# Patient Record
Sex: Female | Born: 1960 | Race: White | Hispanic: No | Smoking: Current every day smoker
Health system: Southern US, Community
[De-identification: ages and names within clinical notes are randomized; demographics above are authoritative.]

## PROBLEM LIST (undated history)

## (undated) DIAGNOSIS — F4310 Post-traumatic stress disorder, unspecified: Secondary | ICD-10-CM

## (undated) DIAGNOSIS — F329 Major depressive disorder, single episode, unspecified: Secondary | ICD-10-CM

## (undated) DIAGNOSIS — M797 Fibromyalgia: Secondary | ICD-10-CM

## (undated) DIAGNOSIS — K222 Esophageal obstruction: Secondary | ICD-10-CM

## (undated) DIAGNOSIS — E039 Hypothyroidism, unspecified: Secondary | ICD-10-CM

## (undated) DIAGNOSIS — K219 Gastro-esophageal reflux disease without esophagitis: Secondary | ICD-10-CM

## (undated) DIAGNOSIS — G8929 Other chronic pain: Secondary | ICD-10-CM

## (undated) DIAGNOSIS — F419 Anxiety disorder, unspecified: Secondary | ICD-10-CM

## (undated) DIAGNOSIS — E119 Type 2 diabetes mellitus without complications: Secondary | ICD-10-CM

## (undated) DIAGNOSIS — F32A Depression, unspecified: Secondary | ICD-10-CM

## (undated) DIAGNOSIS — G629 Polyneuropathy, unspecified: Secondary | ICD-10-CM

## (undated) DIAGNOSIS — M549 Dorsalgia, unspecified: Secondary | ICD-10-CM

## (undated) DIAGNOSIS — G43909 Migraine, unspecified, not intractable, without status migrainosus: Secondary | ICD-10-CM

## (undated) DIAGNOSIS — K635 Polyp of colon: Secondary | ICD-10-CM

## (undated) HISTORY — DX: Anxiety disorder, unspecified: F41.9

## (undated) HISTORY — DX: Migraine, unspecified, not intractable, without status migrainosus: G43.909

## (undated) HISTORY — PX: COLONOSCOPY: SHX174

## (undated) HISTORY — DX: Depression, unspecified: F32.A

## (undated) HISTORY — DX: Fibromyalgia: M79.7

## (undated) HISTORY — DX: Gastro-esophageal reflux disease without esophagitis: K21.9

## (undated) HISTORY — PX: KNEE ARTHROSCOPY: SUR90

## (undated) HISTORY — PX: BACK SURGERY: SHX140

## (undated) HISTORY — PX: ROTATOR CUFF REPAIR: SHX139

## (undated) HISTORY — DX: Polyp of colon: K63.5

## (undated) HISTORY — PX: THYROID SURGERY: SHX805

## (undated) HISTORY — DX: Post-traumatic stress disorder, unspecified: F43.10

## (undated) HISTORY — PX: FOOT SURGERY: SHX648

## (undated) HISTORY — PX: KNEE SURGERY: SHX244

## (undated) HISTORY — DX: Hypothyroidism, unspecified: E03.9

## (undated) HISTORY — PX: PARTIAL HYSTERECTOMY: SHX80

## (undated) HISTORY — DX: Dorsalgia, unspecified: M54.9

## (undated) HISTORY — DX: Other chronic pain: G89.29

## (undated) HISTORY — DX: Polyneuropathy, unspecified: G62.9

## (undated) HISTORY — DX: Type 2 diabetes mellitus without complications: E11.9

## (undated) HISTORY — DX: Major depressive disorder, single episode, unspecified: F32.9

## (undated) HISTORY — DX: Esophageal obstruction: K22.2

---

## 1998-06-24 ENCOUNTER — Emergency Department (HOSPITAL_COMMUNITY): Admission: EM | Admit: 1998-06-24 | Discharge: 1998-06-24 | Payer: Self-pay | Admitting: Emergency Medicine

## 1998-10-16 ENCOUNTER — Other Ambulatory Visit: Admission: RE | Admit: 1998-10-16 | Discharge: 1998-10-16 | Payer: Self-pay | Admitting: *Deleted

## 1999-07-17 ENCOUNTER — Encounter: Admission: RE | Admit: 1999-07-17 | Discharge: 1999-07-17 | Payer: Self-pay | Admitting: *Deleted

## 1999-07-29 ENCOUNTER — Inpatient Hospital Stay (HOSPITAL_COMMUNITY): Admission: RE | Admit: 1999-07-29 | Discharge: 1999-07-31 | Payer: Self-pay | Admitting: *Deleted

## 1999-07-29 ENCOUNTER — Encounter (INDEPENDENT_AMBULATORY_CARE_PROVIDER_SITE_OTHER): Payer: Self-pay

## 2000-05-13 ENCOUNTER — Other Ambulatory Visit: Admission: RE | Admit: 2000-05-13 | Discharge: 2000-05-13 | Payer: Self-pay | Admitting: Obstetrics and Gynecology

## 2000-07-29 ENCOUNTER — Encounter: Admission: RE | Admit: 2000-07-29 | Discharge: 2000-07-29 | Payer: Self-pay | Admitting: *Deleted

## 2000-11-18 ENCOUNTER — Other Ambulatory Visit: Admission: RE | Admit: 2000-11-18 | Discharge: 2000-11-18 | Payer: Self-pay | Admitting: *Deleted

## 2000-12-02 ENCOUNTER — Encounter: Payer: Self-pay | Admitting: Urology

## 2000-12-09 ENCOUNTER — Ambulatory Visit (HOSPITAL_COMMUNITY): Admission: RE | Admit: 2000-12-09 | Discharge: 2000-12-09 | Payer: Self-pay | Admitting: Urology

## 2001-06-27 ENCOUNTER — Emergency Department (HOSPITAL_COMMUNITY): Admission: EM | Admit: 2001-06-27 | Discharge: 2001-06-28 | Payer: Self-pay | Admitting: *Deleted

## 2001-06-27 ENCOUNTER — Encounter: Payer: Self-pay | Admitting: Emergency Medicine

## 2001-07-07 ENCOUNTER — Ambulatory Visit (HOSPITAL_COMMUNITY): Admission: RE | Admit: 2001-07-07 | Discharge: 2001-07-07 | Payer: Self-pay | Admitting: Internal Medicine

## 2001-07-07 ENCOUNTER — Encounter: Payer: Self-pay | Admitting: Internal Medicine

## 2001-07-16 ENCOUNTER — Encounter: Payer: Self-pay | Admitting: Internal Medicine

## 2001-07-16 ENCOUNTER — Ambulatory Visit (HOSPITAL_COMMUNITY): Admission: RE | Admit: 2001-07-16 | Discharge: 2001-07-16 | Payer: Self-pay | Admitting: Internal Medicine

## 2001-11-05 ENCOUNTER — Encounter: Admission: RE | Admit: 2001-11-05 | Discharge: 2001-11-05 | Payer: Self-pay | Admitting: *Deleted

## 2001-11-19 ENCOUNTER — Other Ambulatory Visit: Admission: RE | Admit: 2001-11-19 | Discharge: 2001-11-19 | Payer: Self-pay | Admitting: Obstetrics and Gynecology

## 2001-12-17 ENCOUNTER — Inpatient Hospital Stay (HOSPITAL_COMMUNITY): Admission: AD | Admit: 2001-12-17 | Discharge: 2001-12-17 | Payer: Self-pay | Admitting: Obstetrics and Gynecology

## 2002-04-25 ENCOUNTER — Encounter: Admission: RE | Admit: 2002-04-25 | Discharge: 2002-04-25 | Payer: Self-pay | Admitting: Otolaryngology

## 2002-04-25 ENCOUNTER — Encounter: Payer: Self-pay | Admitting: Otolaryngology

## 2002-06-28 ENCOUNTER — Encounter: Admission: RE | Admit: 2002-06-28 | Discharge: 2002-06-28 | Payer: Self-pay | Admitting: Internal Medicine

## 2002-06-28 ENCOUNTER — Encounter: Payer: Self-pay | Admitting: Internal Medicine

## 2002-09-28 ENCOUNTER — Encounter (INDEPENDENT_AMBULATORY_CARE_PROVIDER_SITE_OTHER): Payer: Self-pay | Admitting: Specialist

## 2002-09-28 ENCOUNTER — Ambulatory Visit (HOSPITAL_COMMUNITY): Admission: RE | Admit: 2002-09-28 | Discharge: 2002-09-28 | Payer: Self-pay | Admitting: Internal Medicine

## 2002-09-28 ENCOUNTER — Encounter: Payer: Self-pay | Admitting: Internal Medicine

## 2003-01-03 ENCOUNTER — Encounter: Payer: Self-pay | Admitting: Emergency Medicine

## 2003-01-03 ENCOUNTER — Emergency Department (HOSPITAL_COMMUNITY): Admission: EM | Admit: 2003-01-03 | Discharge: 2003-01-03 | Payer: Self-pay | Admitting: Emergency Medicine

## 2003-01-08 ENCOUNTER — Emergency Department (HOSPITAL_COMMUNITY): Admission: EM | Admit: 2003-01-08 | Discharge: 2003-01-08 | Payer: Self-pay | Admitting: Emergency Medicine

## 2003-10-19 ENCOUNTER — Encounter: Admission: RE | Admit: 2003-10-19 | Discharge: 2003-10-19 | Payer: Self-pay | Admitting: Internal Medicine

## 2004-03-18 ENCOUNTER — Emergency Department (HOSPITAL_COMMUNITY): Admission: EM | Admit: 2004-03-18 | Discharge: 2004-03-18 | Payer: Self-pay | Admitting: Emergency Medicine

## 2006-09-24 ENCOUNTER — Encounter: Admission: RE | Admit: 2006-09-24 | Discharge: 2006-09-24 | Payer: Self-pay | Admitting: Internal Medicine

## 2006-12-01 ENCOUNTER — Emergency Department (HOSPITAL_COMMUNITY): Admission: EM | Admit: 2006-12-01 | Discharge: 2006-12-01 | Payer: Self-pay | Admitting: *Deleted

## 2007-08-01 ENCOUNTER — Emergency Department (HOSPITAL_COMMUNITY): Admission: EM | Admit: 2007-08-01 | Discharge: 2007-08-01 | Payer: Self-pay | Admitting: Emergency Medicine

## 2008-03-27 ENCOUNTER — Ambulatory Visit (HOSPITAL_COMMUNITY): Admission: RE | Admit: 2008-03-27 | Discharge: 2008-03-27 | Payer: Self-pay | Admitting: Internal Medicine

## 2008-05-12 DIAGNOSIS — Z8659 Personal history of other mental and behavioral disorders: Secondary | ICD-10-CM

## 2008-05-12 DIAGNOSIS — K222 Esophageal obstruction: Secondary | ICD-10-CM

## 2008-05-12 DIAGNOSIS — K219 Gastro-esophageal reflux disease without esophagitis: Secondary | ICD-10-CM | POA: Insufficient documentation

## 2008-05-18 ENCOUNTER — Ambulatory Visit: Payer: Self-pay | Admitting: Internal Medicine

## 2008-05-23 ENCOUNTER — Telehealth: Payer: Self-pay | Admitting: Internal Medicine

## 2008-05-29 ENCOUNTER — Encounter: Payer: Self-pay | Admitting: Internal Medicine

## 2008-05-29 ENCOUNTER — Ambulatory Visit: Payer: Self-pay | Admitting: Internal Medicine

## 2008-05-31 LAB — CONVERTED CEMR LAB: UREASE: NEGATIVE

## 2008-06-01 ENCOUNTER — Encounter: Payer: Self-pay | Admitting: Internal Medicine

## 2008-06-05 ENCOUNTER — Emergency Department (HOSPITAL_COMMUNITY): Admission: EM | Admit: 2008-06-05 | Discharge: 2008-06-05 | Payer: Self-pay | Admitting: Emergency Medicine

## 2008-06-11 ENCOUNTER — Encounter: Admission: RE | Admit: 2008-06-11 | Discharge: 2008-06-11 | Payer: Self-pay | Admitting: Orthopedic Surgery

## 2008-06-29 ENCOUNTER — Encounter: Payer: Self-pay | Admitting: Internal Medicine

## 2008-07-11 ENCOUNTER — Ambulatory Visit (HOSPITAL_COMMUNITY): Admission: RE | Admit: 2008-07-11 | Discharge: 2008-07-11 | Payer: Self-pay | Admitting: Chiropractic Medicine

## 2008-08-01 ENCOUNTER — Telehealth: Payer: Self-pay | Admitting: Internal Medicine

## 2008-08-23 ENCOUNTER — Emergency Department (HOSPITAL_COMMUNITY): Admission: EM | Admit: 2008-08-23 | Discharge: 2008-08-23 | Payer: Self-pay | Admitting: Emergency Medicine

## 2008-10-13 ENCOUNTER — Observation Stay (HOSPITAL_COMMUNITY): Admission: RE | Admit: 2008-10-13 | Discharge: 2008-10-14 | Payer: Self-pay | Admitting: Neurosurgery

## 2008-10-17 ENCOUNTER — Telehealth: Payer: Self-pay | Admitting: Internal Medicine

## 2008-11-12 ENCOUNTER — Emergency Department (HOSPITAL_BASED_OUTPATIENT_CLINIC_OR_DEPARTMENT_OTHER): Admission: EM | Admit: 2008-11-12 | Discharge: 2008-11-12 | Payer: Self-pay | Admitting: Emergency Medicine

## 2008-12-07 ENCOUNTER — Encounter: Admission: RE | Admit: 2008-12-07 | Discharge: 2008-12-07 | Payer: Self-pay | Admitting: Neurosurgery

## 2008-12-28 ENCOUNTER — Encounter: Payer: Self-pay | Admitting: Internal Medicine

## 2009-01-10 ENCOUNTER — Encounter: Admission: RE | Admit: 2009-01-10 | Discharge: 2009-01-10 | Payer: Self-pay | Admitting: Obstetrics and Gynecology

## 2009-08-19 ENCOUNTER — Emergency Department (HOSPITAL_BASED_OUTPATIENT_CLINIC_OR_DEPARTMENT_OTHER): Admission: EM | Admit: 2009-08-19 | Discharge: 2009-08-19 | Payer: Self-pay | Admitting: Emergency Medicine
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
## 2010-01-28 ENCOUNTER — Encounter: Admission: RE | Admit: 2010-01-28 | Discharge: 2010-01-28 | Payer: Self-pay | Admitting: Obstetrics and Gynecology

## 2010-04-30 ENCOUNTER — Ambulatory Visit: Payer: Self-pay | Admitting: Internal Medicine

## 2010-04-30 DIAGNOSIS — IMO0001 Reserved for inherently not codable concepts without codable children: Secondary | ICD-10-CM

## 2010-04-30 DIAGNOSIS — K5900 Constipation, unspecified: Secondary | ICD-10-CM | POA: Insufficient documentation

## 2010-05-07 ENCOUNTER — Ambulatory Visit: Payer: Self-pay | Admitting: Internal Medicine

## 2010-05-09 ENCOUNTER — Encounter: Payer: Self-pay | Admitting: Internal Medicine

## 2010-07-19 ENCOUNTER — Telehealth: Payer: Self-pay | Admitting: Internal Medicine

## 2010-09-19 NOTE — Progress Notes (Signed)
Summary: refill  Phone Note Call from Patient Call back at Home Phone [phone]   Caller: Patient Call For: Dr. Juanda Chance Reason for Call: Refill Medication Details for Reason: Refill Summary of Call: When pt. was in, Dr. Juanda Chance increased her Nexium to two pills a day. Her original script was for one a day and now she is running out. Will you please call in a new prescription for Nexium, 2 pills a day, to the [facility] on [address]  Thank you. Initial call taken by: Schuyler Amor,  July 19, 2010 9:49 AM  Follow-up for Phone Call        left message for patient that she was to take Nexium once daily or twice daily as needed. New prescription sent for #60 Follow-up by: Lamona Curl CMA Duncan Dull),  July 19, 2010 10:37 AM    New/Updated Medications: NEXIUM 40 MG CPDR (ESOMEPRAZOLE MAGNESIUM) Take 1 tablet by mouth twice daily (pharmacy, please d/c prescription for #30 tabs per month) Prescriptions: NEXIUM 40 MG CPDR (ESOMEPRAZOLE MAGNESIUM) Take 1 tablet by mouth twice daily (pharmacy, please d/c prescription for #30 tabs per month)  #60 x 1   Entered by:   Lamona Curl CMA (AAMA)   Authorized by:   Hart Carwin MD   Signed by:   Lamona Curl CMA (AAMA) on 07/19/2010   Method used:   Electronically to        [facility]. [phone]* (retail)       [address]       [place], [place]  [address]       Ph: [phone]       Fax: [phone]   RxID:   [id number] NEXIUM 40 MG CPDR (ESOMEPRAZOLE MAGNESIUM) Take 1 tablet by mouth twice daily (pharmacy, please d/c prescription for #30 tabs per month)  #60 x 1   Entered by:   Lamona Curl CMA (AAMA)   Authorized by:   Hart Carwin MD   Signed by:   Lamona Curl CMA (AAMA) on 07/19/2010   Method used:   Electronically to        [facility]  [phone]* (retail)       [address]       [place], [place]  [address]  Ph: [phone] or [phone]       Fax: [phone]   RxID:   [id number]  prescription sent to [facility] in error. prescription d/ced. [name] CMA Duncan Dull)  July 19, 2010 10:35 AM

## 2010-09-19 NOTE — Letter (Signed)
Summary: Diabetic Instructions  [facility]  [address] [place], [place] [address]   Phone: [phone]  Fax: [phone]    Brittney Becker [birth year] MRN: [id number]   _ x _   ORAL DIABETIC MEDICATION INSTRUCTIONS  The day before your procedure:   Take your diabetic pill as you do normally  The day of your procedure:   Do not take your diabetic pill    We will check your blood sugar levels during the admission process and again in Recovery before discharging you home  ________________________________________________________________________

## 2010-09-19 NOTE — Assessment & Plan Note (Signed)
Summary: constipation--ch.   History of Present Illness Visit Type: Follow-up Visit Primary GI MD: Lina Sar MD Primary Ikesha Siller: Guerry Bruin, MD Requesting Alayja Armas: n/a Chief Complaint: bloating and constipation, pt has not had a bowel movement in over one week, pt also states she is having solid food dysphagia again  History of Present Illness:   This is a 50 year old white female with gastroesophageal reflux and a history of esophageal stricture with recurrent solid food dysphagia. She is complaining of severe constipation. Her last bowel movement was one week ago. There is a family history of colon cancer in her sister. Her last colonoscopy in October 2009 showed multiple hyperplastic polyps. She has taken over-the-counter laxatives with not much improvement. She denies any rectal bleeding. Her main complaint is abdominal distention and abdominal protuberance.   GI Review of Systems    Reports bloating and  heartburn.      Denies abdominal pain, acid reflux, belching, chest pain, dysphagia with liquids, dysphagia with solids, loss of appetite, nausea, vomiting, vomiting blood, weight loss, and  weight gain.      Reports constipation.     Denies anal fissure, black tarry stools, change in bowel habit, diarrhea, diverticulosis, fecal incontinence, heme positive stool, hemorrhoids, irritable bowel syndrome, jaundice, light color stool, liver problems, rectal bleeding, and  rectal pain.    Current Medications (verified): 1)  Metformin Hcl 500 Mg Tabs (Metformin Hcl) .Marland Kitchen.. 1 By Mouth Once Daily 2)  Nexium 40 Mg Cpdr (Esomeprazole Magnesium) .... Take 1 Tablet By Mouth Once A Day 3)  Anusol-Hc 25 Mg Supp (Hydrocortisone Acetate) .... Insert 1 Suppository Into Rectum At Bedtime 4)  Cyclobenzaprine Hcl 10 Mg Tabs (Cyclobenzaprine Hcl) .... Take 1 Tablet By Mouth Three Times A Day As Needed 5)  Cymbalta 60 Mg Cpep (Duloxetine Hcl) .... Take 2 Caps By Mouth Once Daily 6)  Vitamin D  (Ergocalciferol) 50000 Unit Caps (Ergocalciferol) .... Take 1 Ap Twice Weekly 7)  Fentanyl 75 Mcg/hr Pt72 (Fentanyl) .... Remove and Apply New Patch Every 3 Days 8)  Levothyroxine Sodium 125 Mcg Tabs (Levothyroxine Sodium) .... Take 1 Tablet By Mouth Once A Day 9)  Lorazepam 2 Mg Tabs (Lorazepam) .... Take 1-2 Tablets At Bedtime 10)  Lyrica 200 Mg Caps (Pregabalin) .... Take 1 Tablet By Mouth Three Times A Day 11)  Naproxen 375 Mg Tabs (Naproxen) .... Take 1 Tablet By Mouth Three Times A Day As Needed 12)  Oxycodone-Acetaminophen 10-325 Mg Tabs (Oxycodone-Acetaminophen) .... Take 1 Tablet By Mouth Four Times A Day As Needed 13)  Promethazine Hcl 25 Mg Tabs (Promethazine Hcl) .... Take One Tablet Every 4-6 Hours As Needed For Nausea 14)  Tizanidine Hcl 4 Mg Tabs (Tizanidine Hcl) .... Take 1/2 To 1 Tablet Four Times A Day As Needed 15)  Topamax 25 Mg Tabs (Topiramate) .... Take 3 Tablets By Mouth Two Times A Day 16)  Geodon 40 Mg Caps (Ziprasidone Hcl) .... Take 1 Tablet By Mouth Once A Day  Allergies (verified): 1)  ! Codeine 2)  ! Sulfa  Past History:  Past Medical History: Current Problems:  Family Hx of COLON CANCER (ICD-153.9) DEPRESSION, HX OF (ICD-V11.8) Hx of ESOPHAGEAL STRICTURE (ICD-530.3) GERD (ICD-530.81) Hypothyroidism Diabetes  Past Surgical History: Thyroid Hysterectomy Partial Rotator Cuff Repair-Bilateral Knee Arthroscopy-left Back Surgery  Family History: Family History of Colon Cancer: Sister Family History of Diabetes: Mother, Father, 42 brothers & sisters Family History of Breast Cancer: Mother Family History of Liver Cancer: Mother  Social History: Divorced,  5 children Alcohol Use - yes-occasionally Illicit Drug Use - no Patient currently smokes.  Daily Caffeine Use Applied for disability  Review of Systems       The patient complains of back pain.  The patient denies allergy/sinus, anemia, anxiety-new, arthritis/joint pain, blood in urine, breast  changes/lumps, change in vision, confusion, cough, coughing up blood, depression-new, fainting, fatigue, fever, headaches-new, hearing problems, heart murmur, heart rhythm changes, itching, menstrual pain, muscle pains/cramps, night sweats, nosebleeds, pregnancy symptoms, shortness of breath, skin rash, sleeping problems, sore throat, swelling of feet/legs, swollen lymph glands, thirst - excessive, urination - excessive, urination changes/pain, urine leakage, vision changes, and voice change.         Pertinent positive and negative review of systems were noted in the above HPI. All other ROS was otherwise negative.   Vital Signs:  Patient profile:   50 year old female Height:      67 inches Weight:      172 pounds BMI:     27.04 Pulse rate:   92 / minute Pulse rhythm:   regular BP sitting:   120 / 84  (left arm) Cuff size:   regular  Vitals Entered By: Francee Piccolo CMA Duncan Dull) (April 30, 2010 3:43 PM)  Physical Exam  General:  Well developed, well nourished, no acute distress. Mouth:  No deformity or lesions, dentition normal. Neck:  Supple; no masses or thyromegaly. Lungs:  Clear throughout to auscultation. Heart:  Regular rate and rhythm; no murmurs, rubs,  or bruits. Abdomen:  heart soft protuberant. No umbilical hernia. Normoactive bowel sounds. Mild tenderness in epigastrium. Liver edge at costal margin.decreased abdominal muscle tone, Extremities:  No clubbing, cyanosis, edema or deformities noted. Skin:  Intact without significant lesions or rashes. Psych:  Alert and cooperative. Normal mood and affect.   Impression & Recommendations:  Problem # 1:  CONSTIPATION (ICD-564.00) Patient has functional constipation. We will start her on MiraLax 17-34 g daily. She will try a high fiber diet, exercise and weight loss.  Problem # 2:  Family Hx of COLON CANCER (ICD-153.9) Patient is due for a repeat colonoscopy in October 2014.  Problem # 3:  Hx of ESOPHAGEAL STRICTURE  (ICD-530.3)  Patient has recurrent solid food dysphagia. Her last dilatation in October 2009 showed no Barrett's esophagus. She is to continue on Nexium 40 mg daily.  Orders: EGD SAV (EGD SAV)  Patient Instructions: 1)  Nexium 40 mg daily. 2)  Antireflux measures. 3)  Upper endoscopy with esophageal dilatation. 4)  MiraLax 17 g twice a day, adjust the dose p.r.n. 5)  Recall colonoscopy October 2014. 6)  Copy sent to : Dr R.Tisovec 7)  The medication list was reviewed and reconciled.  All changed / newly prescribed medications were explained.  A complete medication list was provided to the patient / caregiver. Prescriptions: MIRALAX  POWD (POLYETHYLENE GLYCOL 3350) Dissolve 1-2 capful (17-34 grams) in at least 8 ounces water/juice and drink once daily  #527 grams x 3   Entered by:   Lamona Curl CMA (AAMA)   Authorized by:   Hart Carwin MD   Signed by:   Lamona Curl CMA (AAMA) on 04/30/2010   Method used:   Electronically to        [facility]  [phone]* (retail)       [address]       [place], [place]  [address]       Ph:  [phone] or [phone]       Fax: [phone]   RxID:   [id number]

## 2010-09-19 NOTE — Letter (Signed)
Summary: EGD Instructions  [facility]  [address] [place], [place] [address]   Phone: [phone]  Fax: [phone]       Brittney Becker    07/25/1961    MRN: [id number]       Procedure Day /Date: Tuesday 05/07/10     Arrival Time: 12:30 pmx     Procedure Time: 1:30 pm     Location of Procedure:                    _ x _ [facility] (4th Floor)  PREPARATION FOR ENDOSCOPY   On 05/07/10 THE DAY OF THE PROCEDURE:  1.   No solid foods, milk or milk products are allowed after midnight the night before your procedure.  2.   Do not drink anything colored red or purple.  Avoid juices with pulp.  No orange juice.  3.  You may drink clear liquids until 11:30 am, which is 2 hours before your procedure.                                                                                                CLEAR LIQUIDS INCLUDE: Water Jello Ice Popsicles Tea (sugar ok, no milk/cream) Powdered fruit flavored drinks Coffee (sugar ok, no milk/cream) Gatorade Juice: apple, white grape, white cranberry  Lemonade Clear bullion, consomm, broth Carbonated beverages (any kind) Strained chicken noodle soup Hard Candy   MEDICATION INSTRUCTIONS  Unless otherwise instructed, you should take regular prescription medications with a small sip of water as early as possible the morning of your procedure.  Diabetic patients - see separate instructions.                OTHER INSTRUCTIONS  You will need a responsible adult at least 52 years of age to accompany you and drive you home.   This person must remain in the waiting room during your procedure.  Wear loose fitting clothing that is easily removed.  Leave jewelry and other valuables at home.  However, you may wish to bring a book to read or an iPod/MP3 player to listen to music as you wait for your procedure to start.  Remove all body piercing jewelry and leave at home.  Total time from sign-in until  discharge is approximately 2-3 hours.  You should go home directly after your procedure and rest.  You can resume normal activities the day after your procedure.  The day of your procedure you should not:   Drive   Make legal decisions   Operate machinery   Drink alcohol   Return to work  You will receive specific instructions about eating, activities and medications before you leave.    The above instructions have been reviewed and explained to me by   Lamona Curl CMA Duncan Dull)  April 30, 2010 4:24 PM     I fully understand and can verbalize these instructions _____________________________ Date 04/30/10

## 2010-09-19 NOTE — Letter (Signed)
Summary: Patient Notice-Endo Biopsy Results  [facility]  [address] [place], [place] [address]   Phone: [phone]  Fax: [phone]        May 09, 2010 MRN: [id number]    Brittney Becker [address] [place], [place]  [address]    Dear Ms. Lorrene Reid,  I am pleased to inform you that the biopsies taken during your recent endoscopic examination did not show any evidence of cancer upon pathologic examination.The biopsies from Your esophagus show mild inlammation due to reflux  Additional information/recommendations:  __No further action is needed at this time.  Please follow-up with      your primary care physician for your other healthcare needs.  __ Please call [phone] to schedule a return visit to review      your condition.  _x_ Continue with the treatment plan as outlined on the day of your      exam.     Please call us if you are having persistent problems or have questions about your condition that have not been fully answered at this time.  Sincerely,  Hart Carwin MD  This letter has been electronically signed by your physician.  Appended Document: Patient Notice-Endo Biopsy Results letter mailed

## 2010-09-19 NOTE — Procedures (Signed)
Summary: Upper Endoscopy  Patient: Brittney Becker Note: All result statuses are Final unless otherwise noted.  Tests: (1) Upper Endoscopy (EGD)   EGD Upper Endoscopy       DONE     [facility]     [address]     [place], [place]  [address]           ENDOSCOPY PROCEDURE REPORT           PATIENT:  Brittney Becker, Brittney Becker  MR#:  [id number]     BIRTHDATE:  1960-12-31, 49 yrs. old  GENDER:  female           ENDOSCOPIST:  Hedwig Morton. Juanda Chance, MD     Referred by:  Guerry Bruin, M.D.           PROCEDURE DATE:  05/07/2010     PROCEDURE:  EGD with biopsy, EGD with dilatation over guidewire     ASA CLASS:  Class III     INDICATIONS:  dysphagia hx GERD, hx es.stricture,last EGD 10/09 on     nexiem 40 mg qd           MEDICATIONS:   Versed 6 mg, Fentanyl 75 mcg, Benadryl 50 mg     TOPICAL ANESTHETIC:  Exactacain Spray           DESCRIPTION OF PROCEDURE:   After the risks benefits and     alternatives of the procedure were thoroughly explained, informed     consent was obtained.  The LB GIF-H180 [id number] endoscope was     introduced through the mouth and advanced to the second portion of     the duodenum, without limitations.  The instrument was slowly     withdrawn as the mucosa was fully examined.     <<PROCEDUREIMAGES>>           irregular Z-line. With standard forceps, a biopsy was obtained and     sent to pathology (see image1, image6, image7, and image5).     Otherwise the examination was normal (see image4, image3, and     image2). no evidence of a stricture, ? dismotility Savary dilation     over a guidewire 17 mm dilator passed over the guidewire     Retroflexed views revealed no abnormalities.    The scope was then     withdrawn from the patient and the procedure completed.           COMPLICATIONS:  None           ENDOSCOPIC IMPRESSION:     1) Irregular Z-line     2) Otherwise normal examination     no stricture, s/p passage of 17 mm dilator     ? es.dismotility?  RECOMMENDATIONS:     1) Await pathology results     2) Anti-reflux regimen to be follow     cont Nexiem 40 mg qd, may increase to bid prn           REPEAT EXAM:  In 0 year(s) for.           ______________________________     Hedwig Morton. Juanda Chance, MD           CC:           n.     eSIGNED:   Hedwig Morton. Brodie at 05/07/2010 02:06 PM           Brittney Becker, Brittney Becker [id number]  Note: An exclamation mark (!) indicates a result that was not dispersed  into the flowsheet. Document Creation Date: 05/07/2010 2:07 PM _______________________________________________________________________  (1) Order result status: Final Collection or observation date-time: 05/07/2010 13:53 Requested date-time:  Receipt date-time:  Reported date-time:  Referring Physician:   Ordering Physician: Lina Sar [phone]) Specimen Source:  Source: Launa Grill Order Number: [id number] Lab site:

## 2010-10-31 LAB — GLUCOSE, CAPILLARY: Glucose-Capillary: 141 mg/dL — ABNORMAL HIGH (ref 70–99)

## 2010-11-03 LAB — DIFFERENTIAL
Basophils Absolute: 0 10*3/uL (ref 0.0–0.1)
Lymphocytes Relative: 28 % (ref 12–46)
Neutro Abs: 4.8 10*3/uL (ref 1.7–7.7)
Neutrophils Relative %: 62 % (ref 43–77)

## 2010-11-03 LAB — BASIC METABOLIC PANEL
BUN: 9 mg/dL (ref 6–23)
Calcium: 9.2 mg/dL (ref 8.4–10.5)
GFR calc non Af Amer: >60 mL/min (ref >60)
Glucose, Bld: 112 mg/dL — ABNORMAL HIGH (ref 70–99)
Sodium: 143 mEq/L (ref 135–145)

## 2010-11-03 LAB — GLUCOSE, CAPILLARY: Glucose-Capillary: 114 mg/dL — ABNORMAL HIGH (ref 70–99)

## 2010-11-03 LAB — CBC
Platelets: 175 10*3/uL (ref 150–400)
RDW: 13 % (ref 11.5–15.5)

## 2010-11-28 LAB — GLUCOSE, CAPILLARY: Glucose-Capillary: 149 mg/dL — ABNORMAL HIGH (ref 70–99)

## 2010-12-02 LAB — GLUCOSE, CAPILLARY: Glucose-Capillary: 114 mg/dL — ABNORMAL HIGH (ref 70–99)

## 2010-12-03 LAB — CBC
MCV: 88.5 fL (ref 78.0–100.0)
Platelets: 184 10*3/uL (ref 150–400)
RDW: 15.4 % (ref 11.5–15.5)
WBC: 6.5 10*3/uL (ref 4.0–10.5)

## 2010-12-03 LAB — BASIC METABOLIC PANEL WITH GFR
BUN: 12 mg/dL (ref 6–23)
CO2: 27 meq/L (ref 19–32)
Calcium: 9 mg/dL (ref 8.4–10.5)
Chloride: 106 meq/L (ref 96–112)
Creatinine, Ser: 0.92 mg/dL (ref 0.4–1.2)
GFR calc non Af Amer: >60 mL/min
Glucose, Bld: 216 mg/dL — ABNORMAL HIGH (ref 70–99)
Potassium: 3.9 meq/L (ref 3.5–5.1)
Sodium: 139 meq/L (ref 135–145)

## 2010-12-03 LAB — GLUCOSE, CAPILLARY
Glucose-Capillary: 108 mg/dL — ABNORMAL HIGH (ref 70–99)
Glucose-Capillary: 111 mg/dL — ABNORMAL HIGH (ref 70–99)
Glucose-Capillary: 198 mg/dL — ABNORMAL HIGH (ref 70–99)

## 2010-12-03 LAB — PROTIME-INR: Prothrombin Time: 13 seconds (ref 11.6–15.2)

## 2010-12-31 NOTE — Op Note (Signed)
NAMEVENDA, DICE                 ACCOUNT NO.:  [id number]   MEDICAL RECORD NO.:  [id number]          PATIENT TYPE:  OBV   LOCATION:  3536                         FACILITY:  [facility]   PHYSICIAN:  Danae Orleans. Venetia Maxon, M.D.  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   PROCEDURE:  Right L5-S1 microdiskectomy with microdissection.   SURGEON:  Danae Orleans. Venetia Maxon, MD   ASSISTANT:  Clydene Fake, MD   ANESTHESIA:  General endotracheal anesthesia.   ESTIMATED BLOOD LOSS:  Minimal.   COMPLICATIONS:  None.   DISPOSITION:  To recovery.   INDICATIONS:  Brittney Becker is a 50 year old woman with bilateral lower  extremity pain and numbness with a central disk herniation at L5-S1  eccentric to the right with caudally migrated disk material at the L5-S1  level.  We elected to take her to surgery for microdiskectomy L5-S1 on  the right.   PROCEDURE:  Ms. Sakuma was brought to the operating room.  Following a  satisfactory and uncomplicated induction of general endotracheal  anesthesia plus intravenous lines, she was placed in a prone position on  the Wilson frame.  Her low back was prepped and draped in the usual  sterile fashion.  The area of planned incision was infiltrated with  local lidocaine.  Incision was made in the midline and carried to the  lumbodorsal fascia which was incised sharply on the right side of  midline.  A subperiosteal dissection was performed exposing the L5-S1  interspace.  Intraoperative x-ray confirmed correct orientation at this  level.  A high-speed drill was used to perform a hemi semi laminectomy  of L5 and this was completed.  Bone  removal was completed with Kerrison  rongeur.  A foraminotomy was performed overlying the S1 nerve root.  The  ligamentum flavum was detached and removed in a piecemeal fashion and  under microdissection technique, the thecal sac and S1 nerve root were  mobilized medially and lateral recess was decompressed.  There was a  contained disk herniation causing compression of the right S1 nerve  root.  Annulus was opened with a hook and using a variety of pituitary  rongeurs with very gentle curettage with Epstein curettes, the disk  space was evacuated of residual loose disk material as well as fairly  significant amount of midline disk material which decompressed the  annulus.  Additional fragments of disk material were removed from the  superior aspect of the S1 interspace.  Subsequently, the coronary  dilator was easily inserted along the course of the nerve root sleeve  and there did not appear to be any residual central disk compression.  The interspaces irrigated, the nerve ends and the residual loose disk  material in the operative site was bathed in Depo-Medrol and fentanyl  and the wound  was closed with interrupted 0 Vicryl sutures  reapproximating the fascia and 2-0 Vicryl subcuticular stitch  reapproximated the subcutaneous tissues and then 3-0 Vicryl subcuticular  stitch on the skin edges.  Wound was dressed with Dermabond.  The  patient was extubated in the operating room and taken to recovery in  stable and satisfactory condition having tolerated the operation.  All  counts were correct at the end of the case.      Danae Orleans. Venetia Maxon, M.D.  Electronically Signed     JDS/MEDQ  D:  10/13/2008  T:  10/14/2008  Job:  [id number]

## 2011-01-03 NOTE — Op Note (Signed)
NAME:  Brittney Becker, Brittney Becker                           ACCOUNT NO.:  [id number]   MEDICAL RECORD NO.:  [id number]                   PATIENT TYPE:  AMB   LOCATION:  ENDO                                 FACILITY:  [facility]   PHYSICIAN:  Lina Sar, M.D. [facility]               DATE OF BIRTH:  11-Feb-1961   DATE OF PROCEDURE:  09/28/2002  DATE OF DISCHARGE:                                 OPERATIVE REPORT   PROCEDURE:  1. Upper endoscopy.  2. Esophageal dilation.   GASTROENTEROLOGIST:  Hedwig Morton. Juanda Chance, M.D.   INDICATIONS FOR PROCEDURE:  This 50 year old black female has a history of  severe gastroesophageal reflux disease  controlled only partially on a  proton pump inhibitor.  She has recently gain weight and has started having  more dysphagia to solids.  Her last endoscopy was done in February 2002  using [facility] dilator.  She also has had severe chronic constipation.  Her  stool has been hemoccult negative on recent exam on 09/21/2002.  She is  undergoing upper endoscopy because of recurrent solid food dysphagia.  The  patient has been on Nexium 40 mg a day.   ENDOSCOPE:  Olympus single-channel video endoscope.   SEDATION:  Versed 7.5 mg IV and Demerol 50 mg IV.   FINDINGS:  Olympus single-channel video endoscope was passed under direct  vision through posterior pharynx into esophagus.  The patient was monitored  by pulse oximeter.  Her oxygen saturations were satisfactory.  She was  mildly agitated through the procedure but quite cooperative.  Vallecula and  pyriform sinuses were normal.  Proximate and _____ esophageal mucosa was  unremarkable. Distal esophagus showed erratic GE junction with  irregularities.  Several shallow ulcerations or erosions on the esophageal  side of the GE junction.  There was increased fibroid tissue and very mild  stricture which allowed the endoscope to traverse into the stomach.  Multiple biopsies were taken from the area of GE junction as well as from  the  erosions.   Stomach:  Stomach was insufflated with air and showed normal appearing  gastric folds to gastric antrum.  Pyloric outlet was unremarkable.  Retroflexion of endoscope revealed normal fundus and cardia.   Duodenum:  Duodenum, bulb, and descending duodenum were normal.   Guidewire was then placed into the stomach through the endoscope.  The  endoscope was retracted and Savary dilator passed over the guidewire without  fluoroscopic guidance using 17 mm dilator.  There was a small amount of  blood on the dilator.  The patient tolerated the procedure well.   IMPRESSION:  1. Grade II reflux esophagitis.  2. Mild esophageal stricture status post biopsy.  3. Status post esophageal dilatation to 51-French.    PLAN:  1. The patient will increase Nexium from 40 mg a day to 40 mg b.i.d. because     of evidence of active esophagitis.  2. Await  results of the biopsies to rule out Barrett's esophagus.  3. The patient will continue on anti-reflux measures including weight loss.                                                Lina Sar, M.D. [facility]    DB/MEDQ  D:  09/28/2002  T:  09/28/2002  Job:  [id number]

## 2011-01-03 NOTE — Procedures (Signed)
[facility]  Patient:    Brittney Becker, Brittney Becker [facility] Visit Number: [id number] MRN: [id number]          Service Type: END Location: ENDO Attending Physician:  Mervin Hack Dictated by:   Hedwig Morton. Juanda Chance, M.D. [facility] Admit Date:  07/16/2001                             Procedure Report  PROCEDURE:  Colonoscopy.  INDICATIONS:  This 50 year old white female has a history of peptic ulcer disease, peptic stricture, status post dilatations in the past. She now comes with mid abdominal pain, new-onset constipation, and weight gain of about 20 to 30 pounds. She has been depressed, has tried different laxatives without much results. On physical exam, stool was Hemoccult negative. She is undergoing colonoscopy. There is no family history of colon cancer.  ENDOSCOPE:  Olympus single-channel videoscope.  SEDATION:  Versed 10 mg IV, Demerol 100 mg IV.  FINDINGS:  Olympus single-channel videoscope was passed through the cheeks into the rectum into the sigmoid colon. The patient was monitored by pulse oximetry. Her oxygen saturations were satisfactory. Her prep was poor. There was large amount of liquid stool throughout the entire colon. The rectal tone and rectal ampulla were unremarkable. There were no internal hemorrhoids. There were some external hemorrhoidal tags which were not bleeding. Sigmoid colon mucosa was normal. There were no diverticula. Colonoscope passed with some difficulty through the descending colon and splenic flexure because of poor prep. The mucosa was visualized only partially because of the large amount of liquid stool. Transverse colon, hepatic flexure, and ascending colon were essentially normal with normal cecum ______ ileocecal valve. A large amount of water was used to irrigate the entire colon to see the mucosa. No gross lesions were seen. The colonoscope was then retracted and colon decompressed.  IMPRESSION: 1. Essentially normal  colonoscopy to the cecum. 2. Poor prep.  PLAN:  The patient will be treated for functional constipation with Colace 100 mg b.i.d. As an alternative, she may take Senokot one or two tablets at bedtime. She was advised to drink prune juice 4 ounces daily. For the intermittent anal irritation, Analpram cream with hydrocortisone to take on p.r.n. basis. Dictated by:   Hedwig Morton. Juanda Chance, M.D. [facility] Attending Physician:  Mervin Hack DD:  07/16/01 TD:  07/16/01 Job: [id number]

## 2011-01-03 NOTE — Op Note (Signed)
[facility]  Patient:    Brittney Becker, Brittney Becker                   MRN: [id number] Proc. Date: 12/09/00 Adm. Date:  [id number] Attending:  Thermon Leyland CC:         Marcelle Overlie, M.D.   Operative Report  PREOPERATIVE DIAGNOSIS: 1. Voiding dysfunction status post pubovaginal sling. 2. Chronic pelvic pain. 3. Possible interstitial cystitis.  POSTOPERATIVE DIAGNOSIS: 1. Voiding dysfunction status post pubovaginal sling. 2. Chronic pelvic pain. 3. No evidence of interstitial cystitis.  OPERATION:  Cystoscopy, urethral dilation, hydraulic overdistention of the bladder, takedown of pubovaginal sling/urethrolysis.  SURGEON:  Barron Alvine, M.D.  ANESTHESIA:  General.  INDICATIONS:  Brittney Becker is a 50 year old female.  Her urologic history is somewhat complicated.  A little less than two years ago, she underwent a vaginal hysterectomy with rectocele repair by Dr. Gildardo Becker.  During that operation, she had performed an A&P repair as well as a hysterectomy and excision of a labial cyst.  We also were involved in her care and performed a pubovaginal sling at the same time.  Initially, she did okay but continued to have some problems with some vaginal scar tissue.  Her voiding status was better, and she had resolution of her stress incontinence but did complain of some positional voiding.  She noted that she really needed to lean forward to void.  We had examined her in the past and felt that her urethral angle was fairly neutral.  She had some ongoing problems with some granulation tissue and a perirectal hematoma.  While she was generally satisfied with Dr. Carey Becker, she had some concerns and wanted a second opinion.  We sent her to Dr. Vincente Becker.  Dr. Vincente Becker treated her several times with silver nitrate for some granulation tissue again, apparently in the posterior aspect of the vaginal vault.  Things did get better for a while, but she came in to  see me recently with some ongoing complaints of urinary hesitancy with positional voiding.  She also had some mild frequency and nocturia, but those were fairly similar to the symptoms that she had preoperatively.  We felt again that her urethral angles did not appear to be markedly over corrected, but given her positional voiding, we certainly felt that some outlet obstruction from the sling was certainly a possibility and that some of her dysfunctional voiding and complaints of dysuria and hesitancy may be due to the sling.  We also thought that interstitial cystitis was a possibility.  We had a long discussion with her about treatment options, and she elected to have cystoscopy with hydraulic overdistention to see how her bladder looked and then possible attempt at takedown of her sling with urethrolysis.  She understood that this would have reasonable but certainly not 100% chance of improving her voiding symptoms, and it is certainly a possibility that she could develop recurrent stress incontinence.  TECHNIQUE AND FINDINGS:  The patient was brought to the operating room where she had successful induction of general anesthesia.  She was placed in the mid lithotomy position and prepped and draped in the usual manner.  We performed gentle urethral dilation to about 28 [place].  We found her urethral angle again to be reasonably neutral.  On cystoscopy, one could see a little bit of fixation in her bladder neck with possible slight over correction based on my assessment.  I went ahead and did a hydraulic overdistention of her  bladder for five minutes at 80 cm of water pressure.  I did not see significant glomerulations, and her bladder capacity was reasonably normal at 800 cc.  I did not think that there was really visual evidence of interstitial cystitis. For that reason, we felt that some of her voiding dysfunction was probably due to some mild over correction of the sling.  We utilized  the weighted vaginal speculum and injected some Marcaine in the anterior vaginal wall.  A Foley catheter was placed.  The tissue planes were quite scarred, but we were able to establish a plane between the vaginal mucosa and the underlying bladder neck and mid urethra.  An obvious sling could not be identified, but one could feel quite a bit of thickening and scar in that region.  With the dilator in the urethra, we made an incision near the junction of the bladder neck and proximal urethra through some of this scar and felt that the urethra did give somewhat.  I did not feel that it quite gave adequately, and for that reason, we went ahead laterally and dissected out the urethra bladder neck from about the 2 oclock position back over to the 10 oclock position but did not go circumferentially around the urethra.  With that, with additional dilators in the urethra and additional cystoscopic evaluation, we felt that the urethra angle was now in its very neutral position, and there was no evidence of angulation or any other process.  The vaginal incision was irrigated and  then closed with a running 3-0 Vicryl suture.  The Foley catheter was placed.  We placed some Marcaine and Pyridium in her bladder and will clamp this for 15 minutes.  We plan on removal of the Foley catheter in the recovery room. Some vaginal packing was applied which will be left in for 18 to 24 hours. The patient appeared to tolerate the procedure well.  Blood loss was relatively minimal.  Sponge and needle counts were correct.  She was brought to the recovery room in stable condition. DD:  12/09/00 TD:  12/10/00 Job: [id number]

## 2011-02-06 ENCOUNTER — Telehealth: Payer: Self-pay | Admitting: Internal Medicine

## 2011-02-06 NOTE — Telephone Encounter (Signed)
Left message for patient that we will put some samples at the front desk although we do not have many available. Suggested she may be able to get omeprazole over the counter if needed until she can afford the Nexium again. 4 Boxes of Nexium put at front desk for patient to pick up.

## 2011-02-21 ENCOUNTER — Other Ambulatory Visit: Payer: Self-pay | Admitting: Obstetrics and Gynecology

## 2011-02-21 DIAGNOSIS — Z1231 Encounter for screening mammogram for malignant neoplasm of breast: Secondary | ICD-10-CM

## 2011-03-25 ENCOUNTER — Ambulatory Visit
Admission: RE | Admit: 2011-03-25 | Discharge: 2011-03-25 | Disposition: A | Discharge status: Home / Self Care | Payer: Medicare Other | Source: Ambulatory Visit | Attending: Obstetrics and Gynecology | Admitting: Obstetrics and Gynecology

## 2011-03-25 DIAGNOSIS — Z1231 Encounter for screening mammogram for malignant neoplasm of breast: Secondary | ICD-10-CM

## 2011-05-20 LAB — POCT I-STAT, CHEM 8
BUN: 12
Calcium, Ion: 1.13
Chloride: 110
Creatinine, Ser: 0.7
Glucose, Bld: 99

## 2011-05-20 LAB — RAPID URINE DRUG SCREEN, HOSP PERFORMED
Cocaine: NOT DETECTED
Opiates: NOT DETECTED

## 2011-05-20 LAB — GLUCOSE, CAPILLARY
Glucose-Capillary: 141 — ABNORMAL HIGH
Glucose-Capillary: 91

## 2012-01-01 ENCOUNTER — Other Ambulatory Visit: Payer: Self-pay | Admitting: *Deleted

## 2012-01-01 MED ORDER — ESOMEPRAZOLE MAGNESIUM 40 MG PO CPDR: 40.0000 mg | DELAYED_RELEASE_CAPSULE | Freq: Two times a day (BID) | ORAL | Status: DC

## 2012-02-04 ENCOUNTER — Other Ambulatory Visit: Payer: Self-pay | Admitting: *Deleted

## 2012-02-04 MED ORDER — ESOMEPRAZOLE MAGNESIUM 40 MG PO CPDR: 40.0000 mg | DELAYED_RELEASE_CAPSULE | Freq: Two times a day (BID) | ORAL | Status: DC

## 2012-07-04 DIAGNOSIS — F4310 Post-traumatic stress disorder, unspecified: Secondary | ICD-10-CM | POA: Insufficient documentation

## 2012-07-21 ENCOUNTER — Ambulatory Visit (HOSPITAL_COMMUNITY): Payer: Self-pay | Admitting: Psychiatry

## 2012-07-26 ENCOUNTER — Ambulatory Visit (HOSPITAL_COMMUNITY): Payer: Self-pay | Admitting: Psychiatry

## 2012-08-16 ENCOUNTER — Ambulatory Visit (HOSPITAL_COMMUNITY): Payer: Self-pay | Admitting: Psychiatry

## 2012-09-16 ENCOUNTER — Other Ambulatory Visit: Payer: Self-pay | Admitting: Obstetrics and Gynecology

## 2012-09-16 DIAGNOSIS — Z1231 Encounter for screening mammogram for malignant neoplasm of breast: Secondary | ICD-10-CM

## 2012-10-14 ENCOUNTER — Ambulatory Visit: Payer: Self-pay

## 2012-11-04 ENCOUNTER — Ambulatory Visit: Payer: Self-pay

## 2013-04-05 ENCOUNTER — Encounter: Payer: Self-pay | Admitting: Internal Medicine

## 2013-04-08 ENCOUNTER — Encounter: Payer: Self-pay | Admitting: Internal Medicine

## 2013-06-08 ENCOUNTER — Ambulatory Visit (AMBULATORY_SURGERY_CENTER): Payer: Medicare Other

## 2013-06-08 VITALS — Ht 67.0 in | Wt 135.0 lb

## 2013-06-08 DIAGNOSIS — Z800 Family history of malignant neoplasm of digestive organs: Secondary | ICD-10-CM

## 2013-06-08 MED ORDER — MOVIPREP 100 G PO SOLR: 1.0000 | Freq: Once | ORAL | Status: DC

## 2013-06-13 ENCOUNTER — Encounter: Payer: Self-pay | Admitting: Internal Medicine

## 2013-06-22 ENCOUNTER — Ambulatory Visit (AMBULATORY_SURGERY_CENTER): Payer: Medicare Other | Admitting: Internal Medicine

## 2013-06-22 ENCOUNTER — Encounter: Payer: Self-pay | Admitting: Internal Medicine

## 2013-06-22 VITALS — BP 131/78 | HR 65 | Temp 96.9°F | Resp 16 | Ht 67.0 in | Wt 135.0 lb

## 2013-06-22 DIAGNOSIS — Z1211 Encounter for screening for malignant neoplasm of colon: Secondary | ICD-10-CM

## 2013-06-22 DIAGNOSIS — D126 Benign neoplasm of colon, unspecified: Secondary | ICD-10-CM

## 2013-06-22 DIAGNOSIS — Z800 Family history of malignant neoplasm of digestive organs: Secondary | ICD-10-CM

## 2013-06-22 MED ORDER — OMEPRAZOLE 20 MG PO CPDR: 20.0000 mg | DELAYED_RELEASE_CAPSULE | Freq: Every day | ORAL | Status: DC

## 2013-06-22 MED ORDER — SODIUM CHLORIDE 0.9 % IV SOLN: 500.0000 mL | INTRAVENOUS | Status: DC

## 2013-06-22 NOTE — Patient Instructions (Signed)
YOU HAD AN ENDOSCOPIC PROCEDURE TODAY AT THE [facility]: Refer to the procedure report that was given to you for any specific questions about what was found during the examination.  If the procedure report does not answer your questions, please call your gastroenterologist to clarify.  If you requested that your care partner not be given the details of your procedure findings, then the procedure report has been included in a sealed envelope for you to review at your convenience later.  YOU SHOULD EXPECT: Some feelings of bloating in the abdomen. Passage of more gas than usual.  Walking can help get rid of the air that was put into your GI tract during the procedure and reduce the bloating. If you had a lower endoscopy (such as a colonoscopy or flexible sigmoidoscopy) you may notice spotting of blood in your stool or on the toilet paper. If you underwent a bowel prep for your procedure, then you may not have a normal bowel movement for a few days.  DIET: Your first meal following the procedure should be a light meal and then it is ok to progress to your normal diet.  A half-sandwich or bowl of soup is an example of a good first meal.  Heavy or fried foods are harder to digest and may make you feel nauseous or bloated.  Likewise meals heavy in dairy and vegetables can cause extra gas to form and this can also increase the bloating.  Drink plenty of fluids but you should avoid alcoholic beverages for 24 hours.  ACTIVITY: Your care partner should take you home directly after the procedure.  You should plan to take it easy, moving slowly for the rest of the day.  You can resume normal activity the day after the procedure however you should NOT DRIVE or use heavy machinery for 24 hours (because of the sedation medicines used during the test).    SYMPTOMS TO REPORT IMMEDIATELY: A gastroenterologist can be reached at any hour.  During normal business hours, 8:30 AM to 5:00 PM Monday through Friday,  call [phone].  After hours and on weekends, please call the GI answering service at [phone] who will take a message and have the physician on call contact you.   Following lower endoscopy (colonoscopy or flexible sigmoidoscopy):  Excessive amounts of blood in the stool  Significant tenderness or worsening of abdominal pains  Swelling of the abdomen that is new, acute  Fever of 100F or higher    FOLLOW UP: If any biopsies were taken you will be contacted by phone or by letter within the next 1-3 weeks.  Call your gastroenterologist if you have not heard about the biopsies in 3 weeks.  Our staff will call the home number listed on your records the next business day following your procedure to check on you and address any questions or concerns that you may have at that time regarding the information given to you following your procedure. This is a courtesy call and so if there is no answer at the home number and we have not heard from you through the emergency physician on call, we will assume that you have returned to your regular daily activities without incident.  SIGNATURES/CONFIDENTIALITY: You and/or your care partner have signed paperwork which will be entered into your electronic medical record.  These signatures attest to the fact that that the information above on your After Visit Summary has been reviewed and is understood.  Full responsibility of the confidentiality   of this discharge information lies with you and/or your care-partner.     

## 2013-06-22 NOTE — Progress Notes (Signed)
Patient did not experience any of the following events: a burn prior to discharge; a fall within the facility; wrong site/side/patient/procedure/implant event; or a hospital transfer or hospital admission upon discharge from the facility. (G8907) Patient did not have preoperative order for IV antibiotic SSI prophylaxis. (G8918)  

## 2013-06-22 NOTE — Op Note (Signed)
[facility] [address] [place], [address]   COLONOSCOPY PROCEDURE REPORT  PATIENT: Brittney, Becker  MR#: [id number] BIRTHDATE: [birth year] , 52  yrs. old GENDER: Female ENDOSCOPIST: Hart Carwin, MD REFERRED BY:Dr Joycelyn Man PROCEDURE DATE:  06/22/2013 PROCEDURE:   Colonoscopy with cold biopsy polypectomy First Screening Colonoscopy - Avg.  risk and is 50 yrs.  old or older - No.  Prior Negative Screening - Now for repeat screening. N/A  History of Adenoma - Now for follow-up colonoscopy & has been > or = to 3 yrs.  N/A  Polyps Removed Today? Yes. ASA CLASS:   Class II INDICATIONS:Patient's immediate family history of colon cancer and sister with colon cancer, last colon 2009- hyperplastic polyps. MEDICATIONS: MAC sedation, administered by CRNA and propofol (Diprivan) 350mg  IV  DESCRIPTION OF PROCEDURE:   After the risks benefits and alternatives of the procedure were thoroughly explained, informed consent was obtained.  A digital rectal exam revealed no abnormalities of the rectum.   The     endoscope was introduced through the anus and advanced to the cecum, which was identified by both the appendix and ileocecal valve. No adverse events experienced.   The quality of the prep was Moviprep fair  The instrument was then slowly withdrawn as the colon was fully examined.      COLON FINDINGS: Five polypoid shaped sessile polyps ranging between 3-47mm in size were found throughout the entire examined colon.  A polypectomy was performed with cold forceps.  The resection was complete and the polyp tissue was completely retrieved. Retroflexed views revealed no abnormalities. The time to cecum 15.o6 min 0 seconds.  Withdrawal time=12 minutes 06 seconds.  The scope was withdrawn and the procedure completed. COMPLICATIONS: There were no complications.  ENDOSCOPIC IMPRESSION: Five sessile polyps ranging between 3-34mm in size were found throughout the entire  examined colon; at 100, 80 ,50 cm and frectumx2   polypectomy was performed with cold forceps first grade internal hemorrhoids  RECOMMENDATIONS: 1.  Await pathology results 2.  High fiber diet 3.   recall colonoscopy in 5 years   eSigned:  Hart Carwin, MD 06/22/2013 8:49 AM   cc:   PATIENT NAME:  Brittney, Becker MR#: [id number]

## 2013-06-22 NOTE — Progress Notes (Signed)
Lidocaine-40mg IV prior to Propofol InductionPropofol given over incremental dosages 

## 2013-06-22 NOTE — Progress Notes (Signed)
Called to room to assist during endoscopic procedure.  Patient ID and intended procedure confirmed with present staff. Received instructions for my participation in the procedure from the performing physician.  

## 2013-06-23 ENCOUNTER — Telehealth: Payer: Self-pay | Admitting: *Deleted

## 2013-06-23 NOTE — Telephone Encounter (Signed)
No answer, left message to call if questions or concerns. 

## 2013-06-27 ENCOUNTER — Encounter: Payer: Self-pay | Admitting: Internal Medicine

## 2013-09-22 ENCOUNTER — Telehealth: Payer: Self-pay | Admitting: Internal Medicine

## 2013-09-22 ENCOUNTER — Encounter: Payer: Self-pay | Admitting: *Deleted

## 2013-09-22 NOTE — Telephone Encounter (Signed)
Spoke with patient and she is having the "feeling that my food doesn't go down." Scheduled with Dr. Olevia Perches tomorrow at 1:45 PM.

## 2013-09-23 ENCOUNTER — Ambulatory Visit (INDEPENDENT_AMBULATORY_CARE_PROVIDER_SITE_OTHER): Payer: Medicare Other | Admitting: Internal Medicine

## 2013-09-23 ENCOUNTER — Encounter: Payer: Self-pay | Admitting: Internal Medicine

## 2013-09-23 VITALS — BP 112/60 | HR 100 | Ht 65.0 in | Wt 147.5 lb

## 2013-09-23 DIAGNOSIS — R1319 Other dysphagia: Secondary | ICD-10-CM

## 2013-09-23 DIAGNOSIS — K219 Gastro-esophageal reflux disease without esophagitis: Secondary | ICD-10-CM

## 2013-09-23 DIAGNOSIS — K222 Esophageal obstruction: Secondary | ICD-10-CM

## 2013-09-23 MED ORDER — ESOMEPRAZOLE MAGNESIUM 40 MG PO CPDR: 40.0000 mg | DELAYED_RELEASE_CAPSULE | Freq: Every day | ORAL | Status: DC

## 2013-09-23 NOTE — Patient Instructions (Addendum)
You have been given a separate informational sheet regarding your tobacco use, the importance of quitting and local resources to help you quit.  You have been scheduled for an endoscopy with propofol. Please follow written instructions given to you at your visit today. If you use inhalers (even only as needed), please bring them with you on the day of your procedure. Your physician has requested that you go to www.startemmi.com and enter the access code given to you at your visit today. This web site gives a general overview about your procedure. However, you should still follow specific instructions given to you by our office regarding your preparation for the procedure.  We have sent the following medications to your pharmacy for you to pick up at your convenience: Nexium (in place of omeprazole)  CC:Dr Robet Leu, Dr Lennie Odor

## 2013-09-23 NOTE — Progress Notes (Signed)
Brittney Becker Jan 16, 1961 [id number]  Note: This dictation was prepared with Dragon digital system. Any transcriptional errors that result from this procedure are unintentional.   History of Present Illness:  This is a 53 year old white female with a history of severe gastroesophageal reflux who has recurrence of solid food dysphagia. Her last upper endoscopy in September 2011 did not show  esophageal stricture but she was dilated with a 17 mm dilator with complete relief of dysphagia. She was on Nexium 40 mg daily but this was discontinued and she is currently on Prilosec 20 mg daily which has not adequately covered her reflux. She is having breakthrough symptoms and for the last 4 weeks,also  solid food dysphagia. She describes specifically, problems swallowing small chunks of apples she mixes with  yogurt. She also regurgitates at night. She would like to be switched back to Nexium. She just recently had a screening colonoscopy by [place].. She has also started smoking again. She has been under a lot of stress since she adopted her daughter's, who is on drugs,  45 months old baby .Marland Kitchen    Past Medical History  Diagnosis Date  . Diabetes   . Anxiety   . Depression   . Fibromyalgia   . Chronic back pain   . Migraines   . Neuropathy   . GERD (gastroesophageal reflux disease)   . Hypothyroidism   . PTSD (post-traumatic stress disorder)   . Esophageal stricture   . Hyperplastic colon polyp     Past Surgical History  Procedure Laterality Date  . Colonoscopy    . Partial hysterectomy    . Rotator cuff repair Bilateral   . Back surgery    . Knee surgery Right   . Foot surgery Right   . Thyroid surgery    . Knee arthroscopy Left     Allergies  Allergen Reactions  . Sulfonamide Derivatives     REACTION: swelling  . Codeine Rash  . Hydrocodone Itching and Rash    Family history and social history have been reviewed.  Review of Systems: Dysphagia to solids. Heartburn regurgitation and  chest pain  The remainder of the 10 point ROS is negative except as outlined in the H&P  Physical Exam: General Appearance Well developed, in no distress Eyes  Non icteric  HEENT  Non traumatic, normocephalic  Mouth No lesion, tongue papillated, no cheilosis, normal voice Neck Supple without adenopathy, thyroid not enlarged, no carotid bruits, no JVD Lungs Clear to auscultation bilaterally COR Normal S1, normal S2, regular rhythm, no murmur, quiet precordium Abdomen tubular and relaxed abdomen no tenderness Rectal not Extremities  No pedal edema Skin No lesions Neurological Alert and oriented x 3 Psychological Normal mood and affect  Assessment and Plan:  Problem #1 Severe gastroesophageal reflux by history as well as history of esophageal stricture.She needs more effective acid suppression. She has recurrent solid food dysphagia. There was no stricture detected on the last endoscopy 4 years ago but her symptoms were relieved by passage of a large dilator. We will proceed with an upper endoscopy and dilatation. We will also switch her from omeprazole to Nexium 40 mg daily. She will try to stop smoking and follow antireflux measures. Problem #2 colorectal screening. She is up to date on colonoscopy.   [name] 09/23/2013

## 2013-09-29 ENCOUNTER — Encounter: Payer: Self-pay | Admitting: Internal Medicine

## 2013-10-05 ENCOUNTER — Ambulatory Visit (AMBULATORY_SURGERY_CENTER): Payer: Medicare Other | Admitting: Internal Medicine

## 2013-10-05 ENCOUNTER — Encounter: Payer: Self-pay | Admitting: Internal Medicine

## 2013-10-05 VITALS — BP 115/64 | HR 68 | Temp 96.8°F | Resp 16 | Ht 65.0 in | Wt 147.0 lb

## 2013-10-05 DIAGNOSIS — K2100 Gastro-esophageal reflux disease with esophagitis, without bleeding: Secondary | ICD-10-CM

## 2013-10-05 DIAGNOSIS — K219 Gastro-esophageal reflux disease without esophagitis: Secondary | ICD-10-CM

## 2013-10-05 DIAGNOSIS — R1319 Other dysphagia: Secondary | ICD-10-CM

## 2013-10-05 DIAGNOSIS — K222 Esophageal obstruction: Secondary | ICD-10-CM

## 2013-10-05 LAB — GLUCOSE, CAPILLARY
GLUCOSE-CAPILLARY: 119 mg/dL — AB (ref 70–99)
Glucose-Capillary: 132 mg/dL — ABNORMAL HIGH (ref 70–99)

## 2013-10-05 MED ORDER — SODIUM CHLORIDE 0.9 % IV SOLN: 500.0000 mL | INTRAVENOUS | Status: DC

## 2013-10-05 NOTE — Progress Notes (Signed)
No complaints noted in the recovery room. Maw   

## 2013-10-05 NOTE — Patient Instructions (Addendum)
YOU HAD AN ENDOSCOPIC PROCEDURE TODAY AT [facility]: Refer to the procedure report that was given to you for any specific questions about what was found during the examination.  If the procedure report does not answer your questions, please call your gastroenterologist to clarify.  If you requested that your care partner not be given the details of your procedure findings, then the procedure report has been included in a sealed envelope for you to review at your convenience later.  YOU SHOULD EXPECT: Some feelings of bloating in the abdomen. Passage of more gas than usual.  Walking can help get rid of the air that was put into your GI tract during the procedure and reduce the bloating. If you had a lower endoscopy (such as a colonoscopy or flexible sigmoidoscopy) you may notice spotting of blood in your stool or on the toilet paper. If you underwent a bowel prep for your procedure, then you may not have a normal bowel movement for a few days.  DIET:   Drink plenty of fluids but you should avoid alcoholic beverages for 24 hours.  Please follw the dilatation diet the rest of the day.  ACTIVITY: Your care partner should take you home directly after the procedure.  You should plan to take it easy, moving slowly for the rest of the day.  You can resume normal activity the day after the procedure however you should NOT DRIVE or use heavy machinery for 24 hours (because of the sedation medicines used during the test).    SYMPTOMS TO REPORT IMMEDIATELY: A gastroenterologist can be reached at any hour.  During normal business hours, 8:30 AM to 5:00 PM Monday through Friday, call [phone].  After hours and on weekends, please call the GI answering service at [phone] who will take a message and have the physician on call contact you.     Following upper endoscopy (EGD)  Vomiting of blood or coffee ground material  New chest pain or pain under the shoulder blades  Painful or  persistently difficult swallowing  New shortness of breath  Fever of 100F or higher  Black, tarry-looking stools  FOLLOW UP: If any biopsies were taken you will be contacted by phone or by letter within the next 1-3 weeks.  Call your gastroenterologist if you have not heard about the biopsies in 3 weeks.  Our staff will call the home number listed on your records the next business day following your procedure to check on you and address any questions or concerns that you may have at that time regarding the information given to you following your procedure. This is a courtesy call and so if there is no answer at the home number and we have not heard from you through the emergency physician on call, we will assume that you have returned to your regular daily activities without incident.  SIGNATURES/CONFIDENTIALITY: You and/or your care partner have signed paperwork which will be entered into your electronic medical record.  These signatures attest to the fact that that the information above on your After Visit Summary has been reviewed and is understood.  Full responsibility of the confidentiality of this discharge information lies with you and/or your care-partner.     Handouts were given to your care partner on a dilatation diet, hiatal hernia, GERD and esophagitis. Await biopsies results.  Should receive a letter within 2 - 3 weeks with results at you home. Please follow the dilatation diet the rest of the day.  You may resume your current medications today. Please call if any questions or concerns. Your blood sugar was 119 in the recovery room.

## 2013-10-05 NOTE — Op Note (Signed)
[place]  [address] [place], [id number]   ENDOSCOPY PROCEDURE REPORT  PATIENT: Brittney, Becker  MR#: [id number] BIRTHDATE: Oct 16, 1960 , 20  yrs. old GENDER: Female ENDOSCOPIST: Lafayette Dragon, MD REFERRED BY:        Damita Dunnings PROCEDURE DATE:  10/05/2013 PROCEDURE:  EGD w/ biopsy and Savary dilation of esophagus ASA CLASS:     Class II INDICATIONS:  Dysphagia.   80 old gastroesophageal reflux and stricture.  Prior endoscopies in 1987, 1989, 1995, 2000, 2004. Status post food impaction in 1985.  Last dilatation in October 2009 with 17 mm Savary. MEDICATIONS: MAC sedation, administered by CRNA and propofol (Diprivan) 300mg  IV TOPICAL ANESTHETIC: none  DESCRIPTION OF PROCEDURE: After the risks benefits and alternatives of the procedure were thoroughly explained, informed consent was obtained.  The LB [id number] endoscope was introduced through the mouth and advanced to the second portion of the duodenum. Without limitations.  The instrument was slowly withdrawn as the mucosa was fully examined.      Esophagus, esophageal mucosa appeared normal in proximal and midesophagus, Z line was irregular and there was intense erythema but no erosions just were consistent with mild esophagitis. There was a mild esophageal stricture at the GE junction which was traversed without difficulty. There was a 2 cm reducible hiatal hernia distal to the stricture  Stomach: Gastric mucosa appeared erythematous but there was no ulceration. There was small amount of bile pooled along the greater curvature of the stomach. The gastric outlet was normal. Retroflexion of the endoscope revealed a normal fundus and cardia Duodenum: Duodenal bulb and descending duodenum was normal Savary dilator passed over the guidewire starting with 16, 17 and 18 mm. There was mild resistance. There was small amount of blood on the last dilator[        The scope was then withdrawn from  the patient and the procedure completed.  COMPLICATIONS: There were no complications. ENDOSCOPIC IMPRESSION: grade 1 esophagitis,With non obstructing stricture Irregular Z line status post biopsies, rule out Barrett's esophagus 1-2 cm reducible hiatal hernia Status post dilatation , using  16-18 mm Savary dilators  RECOMMENDATIONS: 1.  Await pathology results 2.  Anti-reflux regimen to be follow 3.  Continue PPI  REPEAT EXAM: for EGD pending biopsy results.  eSigned:  Lafayette Dragon, MD 10/05/2013 10:46 AM   CC:  PATIENT NAME:  Brittney, Becker MR#: [id number]

## 2013-10-05 NOTE — Progress Notes (Signed)
Called to room to assist during endoscopic procedure.  Patient ID and intended procedure confirmed with present staff. Received instructions for my participation in the procedure from the performing physician.  

## 2013-10-05 NOTE — Progress Notes (Signed)
Lidocaine-40mg IV prior to Propofol InductionPropofol given over incremental dosages 

## 2013-10-06 ENCOUNTER — Telehealth: Payer: Self-pay | Admitting: *Deleted

## 2013-10-06 NOTE — Telephone Encounter (Signed)
No answer, number identifier, follow-up

## 2013-10-10 ENCOUNTER — Encounter: Payer: Self-pay | Admitting: Internal Medicine

## 2013-10-11 ENCOUNTER — Encounter: Payer: Self-pay | Admitting: *Deleted

## 2013-11-11 ENCOUNTER — Other Ambulatory Visit: Payer: Self-pay

## 2013-11-11 DIAGNOSIS — Z1231 Encounter for screening mammogram for malignant neoplasm of breast: Secondary | ICD-10-CM

## 2013-11-11 DIAGNOSIS — Z803 Family history of malignant neoplasm of breast: Secondary | ICD-10-CM

## 2013-11-21 ENCOUNTER — Ambulatory Visit: Payer: Self-pay

## 2013-12-02 ENCOUNTER — Ambulatory Visit: Payer: Self-pay

## 2013-12-05 ENCOUNTER — Ambulatory Visit: Payer: Self-pay

## 2014-03-27 ENCOUNTER — Ambulatory Visit
Admission: RE | Admit: 2014-03-27 | Discharge: 2014-03-27 | Disposition: A | Discharge status: Home / Self Care | Payer: Medicare Other | Source: Ambulatory Visit

## 2014-03-27 DIAGNOSIS — Z803 Family history of malignant neoplasm of breast: Secondary | ICD-10-CM

## 2014-03-27 DIAGNOSIS — Z1231 Encounter for screening mammogram for malignant neoplasm of breast: Secondary | ICD-10-CM

## 2014-11-06 ENCOUNTER — Other Ambulatory Visit: Payer: Self-pay | Admitting: Orthopaedic Surgery

## 2014-11-06 DIAGNOSIS — M25522 Pain in left elbow: Secondary | ICD-10-CM

## 2014-11-25 ENCOUNTER — Ambulatory Visit
Admission: RE | Admit: 2014-11-25 | Discharge: 2014-11-25 | Disposition: A | Discharge status: Home / Self Care | Payer: Medicare Other | Source: Ambulatory Visit | Attending: Orthopaedic Surgery | Admitting: Orthopaedic Surgery

## 2014-11-25 DIAGNOSIS — M25522 Pain in left elbow: Secondary | ICD-10-CM

## 2015-02-20 ENCOUNTER — Other Ambulatory Visit: Payer: Self-pay

## 2015-02-20 DIAGNOSIS — Z1231 Encounter for screening mammogram for malignant neoplasm of breast: Secondary | ICD-10-CM

## 2015-03-30 ENCOUNTER — Ambulatory Visit: Payer: Self-pay

## 2015-04-18 ENCOUNTER — Ambulatory Visit
Admission: RE | Admit: 2015-04-18 | Discharge: 2015-04-18 | Disposition: A | Discharge status: Home / Self Care | Payer: Medicare Other | Source: Ambulatory Visit

## 2015-04-18 DIAGNOSIS — Z1231 Encounter for screening mammogram for malignant neoplasm of breast: Secondary | ICD-10-CM

## 2015-06-22 ENCOUNTER — Other Ambulatory Visit: Payer: Self-pay | Admitting: Family Medicine

## 2015-06-22 DIAGNOSIS — M545 Low back pain: Secondary | ICD-10-CM

## 2015-07-01 ENCOUNTER — Ambulatory Visit
Admission: RE | Admit: 2015-07-01 | Discharge: 2015-07-01 | Disposition: A | Discharge status: Home / Self Care | Payer: Medicare Other | Source: Ambulatory Visit | Attending: Family Medicine | Admitting: Family Medicine

## 2015-07-01 DIAGNOSIS — M545 Low back pain: Secondary | ICD-10-CM

## 2016-05-08 ENCOUNTER — Other Ambulatory Visit: Payer: Self-pay | Admitting: Pain Medicine

## 2016-05-08 DIAGNOSIS — M542 Cervicalgia: Secondary | ICD-10-CM

## 2016-05-18 ENCOUNTER — Ambulatory Visit
Admission: RE | Admit: 2016-05-18 | Discharge: 2016-05-18 | Disposition: A | Discharge status: Home / Self Care | Payer: Medicare Other | Source: Ambulatory Visit | Attending: Pain Medicine | Admitting: Pain Medicine

## 2016-05-18 DIAGNOSIS — M542 Cervicalgia: Secondary | ICD-10-CM

## 2016-08-27 ENCOUNTER — Other Ambulatory Visit: Payer: Self-pay | Admitting: Physician Assistant

## 2016-08-27 DIAGNOSIS — Z1231 Encounter for screening mammogram for malignant neoplasm of breast: Secondary | ICD-10-CM

## 2016-08-28 ENCOUNTER — Ambulatory Visit: Payer: Self-pay

## 2016-08-29 ENCOUNTER — Ambulatory Visit
Admission: RE | Admit: 2016-08-29 | Discharge: 2016-08-29 | Disposition: A | Discharge status: Home / Self Care | Payer: Medicare Other | Source: Ambulatory Visit | Attending: Physician Assistant | Admitting: Physician Assistant

## 2016-08-29 DIAGNOSIS — Z1231 Encounter for screening mammogram for malignant neoplasm of breast: Secondary | ICD-10-CM

## 2016-10-16 HISTORY — PX: AUGMENTATION MAMMAPLASTY: SUR837

## 2017-04-06 ENCOUNTER — Encounter: Payer: Self-pay | Admitting: Emergency Medicine

## 2017-04-06 ENCOUNTER — Emergency Department
Admission: EM | Admit: 2017-04-06 | Discharge: 2017-04-06 | Disposition: A | Discharge status: Home / Self Care | Payer: Medicare Other | Source: Home / Self Care | Attending: Family Medicine | Admitting: Family Medicine

## 2017-04-06 DIAGNOSIS — L0201 Cutaneous abscess of face: Secondary | ICD-10-CM | POA: Diagnosis not present

## 2017-04-06 DIAGNOSIS — L0390 Cellulitis, unspecified: Secondary | ICD-10-CM

## 2017-04-06 MED ORDER — IBUPROFEN 600 MG PO TABS: 600.0000 mg | ORAL_TABLET | Freq: Four times a day (QID) | ORAL | 0 refills | Status: DC | PRN

## 2017-04-06 MED ORDER — DOXYCYCLINE HYCLATE 100 MG PO CAPS: 100.0000 mg | ORAL_CAPSULE | Freq: Two times a day (BID) | ORAL | 0 refills | Status: DC

## 2017-04-06 MED ORDER — OXYCODONE HCL 5 MG PO TABS: 5.0000 mg | ORAL_TABLET | Freq: Four times a day (QID) | ORAL | 0 refills | Status: DC | PRN

## 2017-04-06 NOTE — ED Provider Notes (Signed)
[name]    CSN: [id number] Arrival date & time: 04/06/17  1345     History   Chief Complaint Chief Complaint  Patient presents with  . Abscess    HPI Brittney Becker is a 56 y.o. female.   HPI  Brittney Becker is a 56 y.o. female presenting to UC with c/o 3 days of gradually worsening pain, redness, and swelling of her chin after pulling an ingrown hair on her chin.  Pain is 7/10, aching and sore, worse with light touch. She reports scant drainage from the area yesterday. Denies fever, chills, n/v/d. Pt is a diabetic and notes she has trouble healing after infection but state her sugars are usually in 180s.  She is on metformin only, no insulin.    Past Medical History:  Diagnosis Date  . Anxiety   . Chronic back pain   . Depression   . Diabetes ([place])   . Esophageal stricture   . Fibromyalgia   . GERD (gastroesophageal reflux disease)   . Hyperplastic colon polyp   . Hypothyroidism   . Migraines   . Neuropathy   . PTSD (post-traumatic stress disorder)     Patient Active Problem List   Diagnosis Date Noted  . CONSTIPATION 04/30/2010  . FIBROMYALGIA 04/30/2010  . ESOPHAGEAL STRICTURE 05/12/2008  . GERD 05/12/2008  . DEPRESSION, HX OF 05/12/2008    Past Surgical History:  Procedure Laterality Date  . BACK SURGERY    . COLONOSCOPY    . FOOT SURGERY Right   . KNEE ARTHROSCOPY Left   . KNEE SURGERY Right   . PARTIAL HYSTERECTOMY    . ROTATOR CUFF REPAIR Bilateral   . THYROID SURGERY      OB History    No data available       Home Medications    Prior to Admission medications   Medication Sig Start Date End Date Taking? Authorizing Provider  doxycycline (VIBRAMYCIN) 100 MG capsule Take 1 capsule (100 mg total) by mouth 2 (two) times daily. One po bid x 7 days 04/06/17   Noe Gens, PA-C  DULoxetine (CYMBALTA) 60 MG capsule Take 60 mg by mouth daily.    [provider]  esomeprazole (NEXIUM) 40 MG capsule Take 1 capsule (40 mg total)  by mouth daily at 12 noon. 09/23/13   Lafayette Dragon, MD  ibuprofen (ADVIL,MOTRIN) 600 MG tablet Take 1 tablet (600 mg total) by mouth every 6 (six) hours as needed for mild pain or moderate pain. 04/06/17   Noe Gens, PA-C  levothyroxine (SYNTHROID, LEVOTHROID) 125 MCG tablet Take 125 mcg by mouth daily before breakfast.    [provider]  LORazepam (ATIVAN) 2 MG tablet Take 2 mg by mouth 2 (two) times daily.    [provider]  metFORMIN (GLUCOPHAGE) 500 MG tablet Take 500 mg by mouth 3 (three) times daily with meals.    [provider]  oxyCODONE (OXY IR/ROXICODONE) 5 MG immediate release tablet Take 1 tablet (5 mg total) by mouth every 6 (six) hours as needed for moderate pain or severe pain. 04/06/17   Noe Gens, PA-C  oxyCODONE (ROXICODONE) 15 MG immediate release tablet Take 15 mg by mouth every 4 (four) hours as needed for pain.    [provider]  promethazine (PHENERGAN) 25 MG tablet Take 25 mg by mouth every 6 (six) hours as needed for nausea.    [provider]  topiramate (TOPAMAX) 100 MG tablet Take 100 mg  by mouth 2 (two) times daily.    [provider]    Family History Family History  Problem Relation Age of Onset  . Colon polyps Sister   . Colon cancer Sister   . Breast cancer Mother   . Diabetes Mother   . Liver cancer Mother   . Diabetes Father   . Diabetes Brother   . Diabetes Sister   . Esophageal cancer Neg Hx   . Rectal cancer Neg Hx   . Stomach cancer Neg Hx     Social History Social History  Substance Use Topics  . Smoking status: Current Every Day Smoker    Packs/day: 0.50    Types: E-cigarettes  . Smokeless tobacco: Never Used  . Alcohol use Yes     Comment: socially- special occasions     Allergies   Sulfonamide derivatives; Codeine; and Hydrocodone   Review of Systems Review of Systems  Constitutional: Negative for chills and fever.  Gastrointestinal: Negative for diarrhea, nausea  and vomiting.  Skin: Positive for color change and wound. Negative for rash.     Physical Exam Triage Vital Signs ED Triage Vitals  Enc Vitals Group     BP 04/06/17 1428 (!) 154/92     Pulse Rate 04/06/17 1428 100     Resp --      Temp 04/06/17 1428 98.4 F (36.9 C)     Temp Source 04/06/17 1428 Oral     SpO2 04/06/17 1428 99 %     Weight 04/06/17 1429 142 lb (64.4 kg)     Height --      Head Circumference --      Peak Flow --      Pain Score 04/06/17 1429 7     Pain Loc --      Pain Edu? --      Excl. in [place]? --    No data found.   Updated Vital Signs BP (!) 154/92 (BP Location: Right Arm)   Pulse 100   Temp 98.4 F (36.9 C) (Oral)   Wt 142 lb (64.4 kg)   SpO2 99%   BMI 23.63 kg/m   Visual Acuity Right Eye Distance:   Left Eye Distance:   Bilateral Distance:    Right Eye Near:   Left Eye Near:    Bilateral Near:     Physical Exam  Constitutional: She is oriented to person, place, and time. She appears well-developed and well-nourished. No distress.  HENT:  Head: Normocephalic.    Erythema, warmth and mild edema to chin. Tender. Centralized pustule. No active bleeding or drainage. Minimal fluctuance.   Eyes: EOM are normal.  Neck: Normal range of motion.  Cardiovascular: Normal rate and regular rhythm.   Pulmonary/Chest: Effort normal. No respiratory distress.  Musculoskeletal: Normal range of motion.  Neurological: She is alert and oriented to person, place, and time.  Skin: Skin is warm and dry. She is not diaphoretic. There is erythema.  Psychiatric: She has a normal mood and affect. Her behavior is normal.  Nursing note and vitals reviewed.    UC Treatments / Results  Labs (all labs ordered are listed, but only abnormal results are displayed) Labs Reviewed  WOUND CULTURE    EKG  EKG Interpretation None       Radiology No results found.  Procedures .Marland KitchenIncision and Drainage Date/Time: 04/06/2017 4:00 PM Performed by: Noe Gens Authorized by: Theone Murdoch A   Consent:    Consent obtained:  Verbal  Consent given by:  Patient   Risks discussed:  Bleeding, infection, pain and incomplete drainage   Alternatives discussed:  Delayed treatment (antibiotics only) Location:    Type:  Abscess   Size:  2   Location:  Head   Head location:  Face Pre-procedure details:    Skin preparation:  Antiseptic wash Anesthesia (see MAR for exact dosages):    Anesthesia method:  Topical application and local infiltration   Topical anesthesia: Freeze spray.   Local anesthetic:  Lidocaine 1% WITH epi Procedure type:    Complexity:  Simple Procedure details:    Needle aspiration: no     Incision types:  Single straight   Incision depth:  Subcutaneous   Scalpel blade:  11   Drainage:  Bloody and purulent   Drainage amount:  Scant   Wound treatment:  Wound left open   Packing materials:  None Post-procedure details:    Patient tolerance of procedure:  Tolerated well, no immediate complications   (including critical care time)  Wound culture swab sent to lab  Medications Ordered in UC Medications - No data to display   Initial Impression / Assessment and Plan / UC Course  I have reviewed the triage vital signs and the nursing notes.  Pertinent labs & imaging results that were available during my care of the patient were reviewed by me and considered in my medical decision making (see chart for details).     Hx and exam c/w cellulitis and early abscess of chin. I&D successfully performed with scant discharge.   Final Clinical Impressions(s) / UC Diagnoses   Final diagnoses:  Abscess of chin  Cellulitis of skin   Will start pt on Doxycycline. Encouraged warm compresses.  F/u in 3-4 days if not improving, sooner if worsening.   New Prescriptions Discharge Medication List as of 04/06/2017  2:58 PM    START taking these medications   Details  doxycycline (VIBRAMYCIN) 100 MG capsule Take 1 capsule (100 mg  total) by mouth 2 (two) times daily. One po bid x 7 days, Starting Mon 04/06/2017, Normal    ibuprofen (ADVIL,MOTRIN) 600 MG tablet Take 1 tablet (600 mg total) by mouth every 6 (six) hours as needed for mild pain or moderate pain., Starting Mon 04/06/2017, Normal    !! oxyCODONE (OXY IR/ROXICODONE) 5 MG immediate release tablet Take 1 tablet (5 mg total) by mouth every 6 (six) hours as needed for moderate pain or severe pain., Starting Mon 04/06/2017, Print     !! - Potential duplicate medications found. Please discuss with provider.       Controlled Substance Prescriptions [place] Controlled Substance Registry consulted? Yes, I have consulted the  Controlled Substances Registry for this patient, and feel the risk/benefit ratio today is favorable for proceeding with this prescription for a controlled substance.   Noe Gens, [place] 04/06/17 [phone]

## 2017-04-06 NOTE — Discharge Instructions (Signed)
°  Keep wound clean with warm water and gentle soap. You may apply a warm damp washcloth to area 2-3 times daily for 15-20 minutes at a time.    Be sure to take all the antibiotics as prescribed.  You will be notified in a few days if the culture shows [place] you need to change antibiotics for any reason.  It is important to take ibuprofen every 6-8 hours to help stay on top of the pain and inflammation.  If you develop stomach upset, be sure to take with a snack or a meal.    You may take the oxycodone as needed for severe pain which should resolved within the next 24 hours.

## 2017-04-06 NOTE — ED Triage Notes (Signed)
Pt c/o skin infection on her chin. States it started as a small bump x3 days ago but last night increase swelling, redness and pain. She is diabetic and has hx of skin infections.

## 2017-04-09 ENCOUNTER — Telehealth: Payer: Self-pay | Admitting: Emergency Medicine

## 2017-04-09 LAB — WOUND CULTURE
Gram Stain: NONE SEEN
Gram Stain: NONE SEEN

## 2017-04-09 NOTE — Telephone Encounter (Signed)
Feels 100% better, MRSA use good hygiene

## 2017-10-20 ENCOUNTER — Emergency Department
Admission: EM | Admit: 2017-10-20 | Discharge: 2017-10-20 | Disposition: A | Discharge status: Home / Self Care | Payer: Medicare HMO | Source: Home / Self Care

## 2017-10-20 ENCOUNTER — Other Ambulatory Visit: Payer: Self-pay

## 2017-10-20 DIAGNOSIS — L089 Local infection of the skin and subcutaneous tissue, unspecified: Secondary | ICD-10-CM | POA: Diagnosis not present

## 2017-10-20 MED ORDER — DOXYCYCLINE HYCLATE 100 MG PO CAPS: 100.0000 mg | ORAL_CAPSULE | Freq: Two times a day (BID) | ORAL | 0 refills | Status: DC

## 2017-10-20 NOTE — ED Triage Notes (Signed)
Pt has sores all over her body.  She feels that there is something in her house that is causing this.  She has a spot on her left shin that is oozing.

## 2017-10-20 NOTE — ED Provider Notes (Signed)
[facility]    CSN: [id number] Arrival date & time: 10/20/17  1351     History   Chief Complaint Chief Complaint  Patient presents with  . Rash    HPI Brittney Becker is a 57 y.o. female.   The history is provided by the patient. No language interpreter was used.  Rash  Location:  Leg and hand Leg rash location:  L leg Quality: swelling   Severity:  Moderate Onset quality:  Gradual Timing:  Constant Progression:  Worsening Chronicity:  New Relieved by:  Nothing Worsened by:  Nothing Ineffective treatments:  None tried Pt complains of multiple sores on arms and legs.  Pt reports one area on left leg is red and swollen  Past Medical History:  Diagnosis Date  . Anxiety   . Chronic back pain   . Depression   . Diabetes ([place])   . Esophageal stricture   . Fibromyalgia   . GERD (gastroesophageal reflux disease)   . Hyperplastic colon polyp   . Hypothyroidism   . Migraines   . Neuropathy   . PTSD (post-traumatic stress disorder)     Patient Active Problem List   Diagnosis Date Noted  . CONSTIPATION 04/30/2010  . FIBROMYALGIA 04/30/2010  . ESOPHAGEAL STRICTURE 05/12/2008  . GERD 05/12/2008  . DEPRESSION, HX OF 05/12/2008    Past Surgical History:  Procedure Laterality Date  . BACK SURGERY    . COLONOSCOPY    . FOOT SURGERY Right   . KNEE ARTHROSCOPY Left   . KNEE SURGERY Right   . PARTIAL HYSTERECTOMY    . ROTATOR CUFF REPAIR Bilateral   . THYROID SURGERY      OB History    No data available       Home Medications    Prior to Admission medications   Medication Sig Start Date End Date Taking? Authorizing Provider  doxycycline (VIBRAMYCIN) 100 MG capsule Take 1 capsule (100 mg total) by mouth 2 (two) times daily. 10/20/17   Fransico Meadow, PA-C  DULoxetine (CYMBALTA) 60 MG capsule Take 60 mg by mouth daily.    [provider]  esomeprazole (NEXIUM) 40 MG capsule Take 1 capsule (40 mg total) by mouth daily at 12 noon. 09/23/13    Lafayette Dragon, MD  ibuprofen (ADVIL,MOTRIN) 600 MG tablet Take 1 tablet (600 mg total) by mouth every 6 (six) hours as needed for mild pain or moderate pain. 04/06/17   Noe Gens, PA-C  levothyroxine (SYNTHROID, LEVOTHROID) 125 MCG tablet Take 125 mcg by mouth daily before breakfast.    [provider]  LORazepam (ATIVAN) 2 MG tablet Take 2 mg by mouth 2 (two) times daily.    [provider]  metFORMIN (GLUCOPHAGE) 500 MG tablet Take 500 mg by mouth 3 (three) times daily with meals.    [provider]  oxyCODONE (OXY IR/ROXICODONE) 5 MG immediate release tablet Take 1 tablet (5 mg total) by mouth every 6 (six) hours as needed for moderate pain or severe pain. 04/06/17   Noe Gens, PA-C  oxyCODONE (ROXICODONE) 15 MG immediate release tablet Take 15 mg by mouth every 4 (four) hours as needed for pain.    [provider]  promethazine (PHENERGAN) 25 MG tablet Take 25 mg by mouth every 6 (six) hours as needed for nausea.    [provider]  topiramate (TOPAMAX) 100 MG tablet Take 100 mg by mouth 2 (two) times daily.    [provider]  Family History Family History  Problem Relation Age of Onset  . Colon polyps Sister   . Colon cancer Sister   . Breast cancer Mother   . Diabetes Mother   . Liver cancer Mother   . Diabetes Father   . Diabetes Brother   . Diabetes Sister   . Esophageal cancer Neg Hx   . Rectal cancer Neg Hx   . Stomach cancer Neg Hx     Social History Social History   Tobacco Use  . Smoking status: Current Every Day Smoker    Packs/day: 0.50    Types: E-cigarettes  . Smokeless tobacco: Never Used  Substance Use Topics  . Alcohol use: Yes    Comment: socially- special occasions  . Drug use: No     Allergies   Sulfonamide derivatives; Codeine; and Hydrocodone   Review of Systems Review of Systems  Skin: Positive for rash.  All other systems reviewed and are negative.    Physical Exam Triage  Vital Signs ED Triage Vitals  Enc Vitals Group     BP 10/20/17 1423 (!) 150/98     Pulse Rate 10/20/17 1423 98     Resp --      Temp 10/20/17 1423 97.9 F (36.6 C)     Temp Source 10/20/17 1423 Oral     SpO2 10/20/17 1423 98 %     Weight 10/20/17 1424 135 lb (61.2 kg)     Height 10/20/17 1424 5\' 6"  (1.676 m)     Head Circumference --      Peak Flow --      Pain Score 10/20/17 1423 7     Pain Loc --      Pain Edu? --      Excl. in [place]? --    No data found.  Updated Vital Signs BP (!) 150/98 (BP Location: Right Arm)   Pulse 98   Temp 97.9 F (36.6 C) (Oral)   Ht 5\' 6"  (1.676 m)   Wt 135 lb (61.2 kg)   SpO2 98%   BMI 21.79 kg/m   Visual Acuity Right Eye Distance:   Left Eye Distance:   Bilateral Distance:    Right Eye Near:   Left Eye Near:    Bilateral Near:     Physical Exam  Constitutional: She appears well-developed and well-nourished.  Musculoskeletal: Normal range of motion.  Multiple sores arms and legs,  2cm red area left mid lower leg,   Neurological: She is alert.  Skin: There is erythema.  Psychiatric: She has a normal mood and affect.  Nursing note and vitals reviewed.    UC Treatments / Results  Labs (all labs ordered are listed, but only abnormal results are displayed) Labs Reviewed  WOUND CULTURE    EKG  EKG Interpretation None       Radiology No results found.  Procedures Procedures (including critical care time)  Medications Ordered in UC Medications - No data to display   Initial Impression / Assessment and Plan / UC Course  I have reviewed the triage vital signs and the nursing notes.  Pertinent labs & imaging results that were available during my care of the patient were reviewed by me and considered in my medical decision making (see chart for details).     Pt asked for pain medication. Pt advised tylenol and warm compresses.   Final Clinical Impressions(s) / UC Diagnoses   Final diagnoses:  Skin infection     ED Discharge Orders  Ordered    doxycycline (VIBRAMYCIN) 100 MG capsule  2 times daily,   Status:  Discontinued     10/20/17 1454    doxycycline (VIBRAMYCIN) 100 MG capsule  2 times daily,   Status:  Discontinued     10/20/17 1458    doxycycline (VIBRAMYCIN) 100 MG capsule  2 times daily     10/20/17 1459      An After Visit Summary was printed and given to the patient. Controlled Substance Prescriptions [place] Controlled Substance Registry consulted? Not Applicable   Fransico Meadow, [place] 10/20/17 1507

## 2017-10-23 ENCOUNTER — Telehealth: Payer: Self-pay | Admitting: Emergency Medicine

## 2017-10-23 LAB — WOUND CULTURE
MICRO NUMBER: 90282982
SPECIMEN QUALITY: ADEQUATE

## 2017-10-23 NOTE — Telephone Encounter (Signed)
Second try to reach patient.

## 2017-10-24 NOTE — Telephone Encounter (Signed)
3rd attempt to reach patient. No answer, full voicemail.

## 2018-01-08 ENCOUNTER — Other Ambulatory Visit: Payer: Self-pay | Admitting: Physician Assistant

## 2018-01-08 DIAGNOSIS — Z1231 Encounter for screening mammogram for malignant neoplasm of breast: Secondary | ICD-10-CM

## 2018-02-08 ENCOUNTER — Ambulatory Visit: Payer: Medicare HMO

## 2018-03-03 ENCOUNTER — Ambulatory Visit: Payer: Medicare HMO

## 2018-03-24 ENCOUNTER — Ambulatory Visit
Admission: RE | Admit: 2018-03-24 | Discharge: 2018-03-24 | Disposition: A | Discharge status: Home / Self Care | Payer: Medicare HMO | Source: Ambulatory Visit | Attending: Physician Assistant | Admitting: Physician Assistant

## 2018-03-24 DIAGNOSIS — Z1231 Encounter for screening mammogram for malignant neoplasm of breast: Secondary | ICD-10-CM

## 2018-08-22 ENCOUNTER — Encounter: Payer: Self-pay | Admitting: Gastroenterology

## 2018-09-21 ENCOUNTER — Encounter: Payer: Self-pay | Admitting: Gastroenterology

## 2018-10-18 ENCOUNTER — Ambulatory Visit: Payer: Medicare HMO | Admitting: Gastroenterology

## 2018-11-30 ENCOUNTER — Ambulatory Visit: Payer: Medicare HMO | Admitting: Gastroenterology

## 2019-01-14 ENCOUNTER — Other Ambulatory Visit: Payer: Self-pay | Admitting: Physician Assistant

## 2019-01-14 DIAGNOSIS — Z1231 Encounter for screening mammogram for malignant neoplasm of breast: Secondary | ICD-10-CM

## 2019-03-28 ENCOUNTER — Ambulatory Visit: Payer: Medicare HMO

## 2019-08-01 ENCOUNTER — Inpatient Hospital Stay: Admission: RE | Admit: 2019-08-01 | Payer: Medicare HMO | Source: Ambulatory Visit

## 2019-10-31 ENCOUNTER — Encounter: Payer: Self-pay | Admitting: Gastroenterology

## 2019-11-07 ENCOUNTER — Encounter: Payer: Self-pay | Admitting: Gastroenterology

## 2019-11-23 ENCOUNTER — Ambulatory Visit
Admission: RE | Admit: 2019-11-23 | Discharge: 2019-11-23 | Disposition: A | Discharge status: Home / Self Care | Payer: Medicare HMO | Source: Ambulatory Visit | Attending: Physician Assistant | Admitting: Physician Assistant

## 2019-11-23 ENCOUNTER — Other Ambulatory Visit: Payer: Self-pay

## 2019-11-23 DIAGNOSIS — Z1231 Encounter for screening mammogram for malignant neoplasm of breast: Secondary | ICD-10-CM

## 2019-12-02 DIAGNOSIS — G43909 Migraine, unspecified, not intractable, without status migrainosus: Secondary | ICD-10-CM | POA: Insufficient documentation

## 2019-12-02 DIAGNOSIS — G629 Polyneuropathy, unspecified: Secondary | ICD-10-CM | POA: Insufficient documentation

## 2019-12-02 DIAGNOSIS — E039 Hypothyroidism, unspecified: Secondary | ICD-10-CM | POA: Insufficient documentation

## 2019-12-02 DIAGNOSIS — F32A Depression, unspecified: Secondary | ICD-10-CM | POA: Insufficient documentation

## 2019-12-02 DIAGNOSIS — M797 Fibromyalgia: Secondary | ICD-10-CM | POA: Insufficient documentation

## 2019-12-02 DIAGNOSIS — E119 Type 2 diabetes mellitus without complications: Secondary | ICD-10-CM | POA: Insufficient documentation

## 2019-12-02 DIAGNOSIS — F329 Major depressive disorder, single episode, unspecified: Secondary | ICD-10-CM | POA: Insufficient documentation

## 2019-12-02 DIAGNOSIS — F419 Anxiety disorder, unspecified: Secondary | ICD-10-CM | POA: Insufficient documentation

## 2019-12-06 ENCOUNTER — Ambulatory Visit: Payer: Medicare HMO | Admitting: Gastroenterology

## 2020-10-26 ENCOUNTER — Other Ambulatory Visit: Payer: Self-pay | Admitting: Physician Assistant

## 2020-10-26 DIAGNOSIS — Z1231 Encounter for screening mammogram for malignant neoplasm of breast: Secondary | ICD-10-CM

## 2020-12-20 ENCOUNTER — Ambulatory Visit: Payer: Medicare HMO

## 2021-02-20 ENCOUNTER — Ambulatory Visit: Payer: Medicare Other | Admitting: Orthopaedic Surgery

## 2021-03-06 ENCOUNTER — Ambulatory Visit: Payer: Medicare Other | Admitting: Orthopaedic Surgery

## 2021-04-01 ENCOUNTER — Other Ambulatory Visit: Payer: Self-pay | Admitting: Physician Assistant

## 2021-04-01 ENCOUNTER — Ambulatory Visit: Payer: Medicare Other | Admitting: Physician Assistant

## 2021-04-01 DIAGNOSIS — Z1231 Encounter for screening mammogram for malignant neoplasm of breast: Secondary | ICD-10-CM

## 2021-04-09 ENCOUNTER — Ambulatory Visit: Payer: Medicare Other

## 2021-04-17 ENCOUNTER — Telehealth: Payer: Self-pay | Admitting: Orthopaedic Surgery

## 2021-04-17 NOTE — Telephone Encounter (Signed)
Pt called requesting to be on PA Clark and Dr. Ninfa Linden cancellation list. Pt states she is in severe pains and couldn't make previous appts due to multiple deaths in the family and forgot to call and cancel. Please call patient about this matter. Pt phone number is [phone].

## 2021-04-17 NOTE — Telephone Encounter (Signed)
No; pt just has to call to check for cancellations

## 2021-05-01 ENCOUNTER — Ambulatory Visit: Payer: Medicare Other | Admitting: Orthopaedic Surgery

## 2021-05-03 ENCOUNTER — Ambulatory Visit: Payer: Medicare Other

## 2021-05-13 ENCOUNTER — Ambulatory Visit (INDEPENDENT_AMBULATORY_CARE_PROVIDER_SITE_OTHER): Payer: Medicare Other | Admitting: Physician Assistant

## 2021-05-13 ENCOUNTER — Other Ambulatory Visit: Payer: Self-pay

## 2021-05-13 ENCOUNTER — Encounter: Payer: Self-pay | Admitting: Physician Assistant

## 2021-05-13 ENCOUNTER — Ambulatory Visit: Payer: Self-pay

## 2021-05-13 DIAGNOSIS — M25552 Pain in left hip: Secondary | ICD-10-CM | POA: Diagnosis not present

## 2021-05-13 DIAGNOSIS — M25562 Pain in left knee: Secondary | ICD-10-CM

## 2021-05-13 DIAGNOSIS — G8929 Other chronic pain: Secondary | ICD-10-CM | POA: Diagnosis not present

## 2021-05-13 NOTE — Progress Notes (Addendum)
Office Visit Note   Patient: Brittney Becker           Date of Birth: May 06, 1961           MRN: [id number] Visit Date: 05/13/2021              Requested by: Edmonia James, PA-C [address] [place],  [place] [address] PCP: Edmonia James, PA-C   Assessment & Plan: Visit Diagnoses:  1. Chronic pain of left knee   2. 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  Apply Voltaren gel to the left knee 4 g 4 times daily.  She is given a hinged knee brace to help with stability.  Would like to see her back in 4 weeks.  Mainly to  give her time to work through the grieving process she is dealing with now.  She most likely will require a left total knee arthroplasty in the future.  She will continue to use the cane in her right hand.  Follow-Up Instructions: Return in about 4 weeks (around 06/10/2021).   Orders:  Orders Placed This Encounter  Procedures   XR HIP UNILAT W OR W/O PELVIS 2-3 VIEWS LEFT   XR Knee 1-2 Views Left   No orders of the defined types were placed in this encounter.     Procedures: No procedures performed   Clinical Data: No additional findings.   Subjective: Chief Complaint  Patient presents with   Left Knee - Pain    HPI Brittney Becker is a 61 year old female were seen for the first time today for left knee pain and left hip pain.  She is seen through the [facility] system and was treated for her left knee.  She has been treated with a knee brace which gave her some relief.  She also been treated with oral anti-inflammatories mainly over-the-counter that is given her no real relief.  Per notes from care everywhere showed that she was given a left knee Synvisc 1 injection on 01/09/2021 in the [facility] system.  Patient states that her knee was aspirated she is not sure that she was actually given a gel injection.  She is having severe knee pain is keeping her from ambulating any long distances.  She does note that she had knee  arthroscopy more than 10 years ago.  She states the treatments in regards to the left knee are not helping at this point.  She is unfortunately dealing with the death of 2 siblings and her son and states that she is unable to even go to grieving classes due to the left knee pain.  No new injury left knee.  States that her knee brace is completely worn out.  She does use a cane to ambulate.  She is also having some left hip pain groin region at times. Patient is diabetic reports hemoglobin A1c to be approximately 7.2. MRI report from [facility] is reviewed and shows tricompartmental arthritis with loss of the central weightbearing aspect of the lateral compartment.  This was read as being moderate to severe.  Medial compartmental mild to moderate partial-thickness femoral and tibial cartilage loss.  Patella femoral compartment with moderate partial-thickness cartilage loss involving the median ridge and medial facet and medial trochlea.   Review of Systems See HPI otherwise negative or noncontributory  Objective: Vital Signs: There were no vitals taken for this visit.  Physical Exam Constitutional:      Appearance: She is  not ill-appearing or diaphoretic.  Pulmonary:     Effort: Pulmonary effort is normal.  Neurological:     Mental Status: She is alert.  Psychiatric:        Mood and Affect: Mood normal.    Ortho Exam Bilateral knees no abnormal warmth erythema or effusion.  Extension lag of approximately 7 to 10 degrees left knee.  Full range of motion of the right knee without pain.  Limited range of motion left knee due to pain.  Patellofemoral crepitus with passive range of motion left knee.  Global tenderness about the left knee.  Bilateral hips right hip excellent range of motion without pain.  She has limited range of motion of both the left hip due to subjective left knee pain.  Significant atrophy quad muscles bilaterally.   Specialty Comments:  No specialty comments  available.  Imaging: XR HIP UNILAT W OR W/O PELVIS 2-3 VIEWS LEFT  Result Date: 05/13/2021 AP pelvis lateral view left hip: No acute fractures.  Both hips well located.  Periarticular spurring left femoral head.  Left hip joint bilaterally overall maintained.  XR Knee 1-2 Views Left  Result Date: 05/13/2021 Left knee 2 views: No acute fracture.  Moderate patellofemoral arthritic changes.  Lateral compartment with marginal osteophyte off the femoral condyle and flattening of the femoral condyle consistent with moderate to severe arthritis.  Mild to moderate changes involving the medial compartment.  Knee is well located.    PMFS History: Patient Active Problem List   Diagnosis Date Noted   Anxiety 12/02/2019   Depression 12/02/2019   Fibromyalgia 12/02/2019   Hypothyroidism 12/02/2019   Migraine 12/02/2019   Neuropathy 12/02/2019   Type II or unspecified type diabetes mellitus without mention of complication, not stated as uncontrolled 12/02/2019   Bilateral shoulder pain 10/08/2012   High risk medication use 10/08/2012   Lumbar post-laminectomy syndrome 10/08/2012   PTSD (post-traumatic stress disorder) 07/04/2012   CONSTIPATION 04/30/2010   FIBROMYALGIA 04/30/2010   ESOPHAGEAL STRICTURE 05/12/2008   GERD (gastroesophageal reflux disease) 05/12/2008   DEPRESSION, HX OF 05/12/2008   Past Medical History:  Diagnosis Date   Anxiety    Chronic back pain    Depression    Diabetes (HCC)    Esophageal stricture    Fibromyalgia    GERD (gastroesophageal reflux disease)    Hyperplastic colon polyp    Hypothyroidism    Migraines    Neuropathy    PTSD (post-traumatic stress disorder)     Family History  Problem Relation Age of Onset   Colon polyps Sister    Colon cancer Sister    Breast cancer Mother    Diabetes Mother    Liver cancer Mother    Diabetes Father    Diabetes Brother    Diabetes Sister    Esophageal cancer Neg Hx    Rectal cancer Neg Hx    Stomach cancer  Neg Hx     Past Surgical History:  Procedure Laterality Date   AUGMENTATION MAMMAPLASTY Bilateral 10/2016   BACK SURGERY     COLONOSCOPY     FOOT SURGERY Right    KNEE ARTHROSCOPY Left    KNEE SURGERY Right    PARTIAL HYSTERECTOMY     ROTATOR CUFF REPAIR Bilateral    THYROID SURGERY     Social History   Occupational History   Not on file  Tobacco Use   Smoking status: Every Day    Packs/day: 0.50    Types: E-cigarettes, Cigarettes  Smokeless tobacco: Never  Vaping Use   Vaping Use: Every day  Substance and Sexual Activity   Alcohol use: Yes    Comment: socially- special occasions   Drug use: No   Sexual activity: Not on file

## 2021-06-11 ENCOUNTER — Ambulatory Visit (INDEPENDENT_AMBULATORY_CARE_PROVIDER_SITE_OTHER): Payer: Medicare Other | Admitting: Orthopaedic Surgery

## 2021-06-11 DIAGNOSIS — M1712 Unilateral primary osteoarthritis, left knee: Secondary | ICD-10-CM | POA: Diagnosis not present

## 2021-06-11 NOTE — Progress Notes (Signed)
HPI: Brittney Becker returns today for follow-up of her left knee pain.  She states that she has been going to grieving counseling 3 times a week and is definitely helping.  Her main complaint at this point is the pain in the knee and the fact that it adversely affects her life.  Again she is someone that has tried conservative treatment including supplemental injections without any real relief.  MRI did show tricompartmental arthritis of her knee with loss of the central weightbearing aspect of the lateral compartment.  Medial compartment with mild to moderate partial-thickness femoral and tibial cartilage loss.  Patellofemoral compartment with moderate partial-thickness cartilage loss involving the median ridge and medial facet also the trochlear groove. She has been trying to do some of the exercises as shown at last visit but states that her range of motion is still diminished.  She does feel that the Voltaren gel has helped some.  At this point time she is wanting to proceed with a left total knee arthroplasty as soon as possible.  She reports her diabetes is under good control.   Review of systems: See HPI otherwise negative or noncontributory.  Physical exam General well-developed well-nourished female no acute distress mood affect appropriate ambulates without any assistive device.  Left knee she lacks about 5 degrees in full extension and flexes to 85 degrees.  Has severe pain with any attempts of range of motion of the knee.  Calf supple nontender.  Impression: Tricompartmental arthritis left knee  Plan: Given the fact the patient has failed conservative treatment which is included injections medications time and exercise and continues to have pain in the left knee that affects her activities of daily living recommend left total knee arthroplasty.  She like to proceed with this in the near future.  Risk benefits of surgery discussed with patient at length.  Postoperative protocol discussed with  patient.  Questions encouraged and answered by Dr. Ninfa Linden and myself.  We will see her back 2 weeks postop.

## 2021-06-12 ENCOUNTER — Telehealth: Payer: Self-pay | Admitting: Orthopaedic Surgery

## 2021-06-12 NOTE — Telephone Encounter (Signed)
LMOM for patient that I had returned her call

## 2021-06-12 NOTE — Telephone Encounter (Signed)
Pt called stating she had an appt on 06/11/21 and surgery was discussed. She states she has some questions and would like a CB please.  [phone]

## 2021-06-14 NOTE — Telephone Encounter (Signed)
Pt returning message for [place]. The best call back number is [phone].

## 2021-06-19 NOTE — Telephone Encounter (Signed)
I called patient.  She needs current A1c done.  She will get done and call me back.  Patient stated she has had several deaths of close family members so hasn't been to have blood work in a while.

## 2021-09-26 ENCOUNTER — Ambulatory Visit: Payer: Medicare Other

## 2021-09-26 ENCOUNTER — Inpatient Hospital Stay: Admission: RE | Admit: 2021-09-26 | Payer: Medicare Other | Source: Ambulatory Visit

## 2021-10-10 ENCOUNTER — Encounter: Payer: Self-pay | Admitting: Nurse Practitioner

## 2021-10-15 ENCOUNTER — Other Ambulatory Visit: Payer: Self-pay

## 2021-10-18 ENCOUNTER — Ambulatory Visit: Payer: Medicare Other | Admitting: Nurse Practitioner

## 2021-10-28 ENCOUNTER — Ambulatory Visit: Payer: Medicare Other | Admitting: Nurse Practitioner

## 2021-10-28 NOTE — Progress Notes (Deleted)
? ? ? ?10/28/2021 ?Trixie Maclaren ?[id number] ?Jan 07, 1961 ? ? ?CHIEF COMPLAINT:  ? ?HISTORY OF PRESENT ILLNESS: Brittney Becker is a 62 year old female with a past medical history of anxiety, PTS D, depression, hypertension, hypothyroidism, diabetes mellitus type 2, neuropathy, chronic back pain, migraine headaches, esophageal stricture, GERD and colon polyps. ? ?She presents to our office today as referred by Rosann Auerbach L. White  NP to schedule a colonoscopy. ? ? ? ?Sister with history of colon cancer ? ? ?Labs in care everywhere ? ?EGD 10/05/2013: ?Grade 1 esophagitis with nonobstructing stricture ?Irregular Z-line status post biopsies, rule out Barrett's esophagus ?1 to 2 cm reducible hiatal hernia ?Status postdilatation using 16-18 mm Savary dilators ?- BENIGN GASTROESOPHAGEAL JUNCTION MUCOSA WITH SLIGHT INFLAMMATION CONSISTENT ?WITH REFLUX. NO INTESTINAL METAPLASIA, HELICOBACTER PYLORI, DYSPLASIA OR ?MALIGNANCY. ? ?Colonoscopy 06/22/2013: ?5 sessile polyps ranging between 3 to 5 mm in size were found throughout the entire examined colon ?1. Surgical [P], colon polyp @ 100cm, colon polyp @ 80cm, colon polyp @ 50cm ?- HYPERPLASTIC POLYP AND POLYPOID FRAGMENTS OF BENIGN COLONIC MUCOSA. ?- NO ADENOMATOUS CHANGES OR MALIGNANCY. ?2. Surgical [P], rectum ?- HYPERPLASTIC POLYPS AND POLYPOID FRAGMENTS OF BENIGN COLONIC MUCOSA. ?- NO ADENOMATOUS CHANGES OR MALIGNANCY. ? ?EGD 05/29/2008: Reflux esophagitis, grade A ? ?Colonoscopy 05/29/2008: ?3 mm sessile polyp removed from the sigmoid colon ? ?1. ESOPHAGUS, BIOPSIES: BENIGN SQUAMOUS AND GASTRIC CARDIA  ? MUCOSA. NO INTESTINAL METAPLASIA, DYSPLASIA OR MALIGNANCY  ? IDENTIFIED.  ? ? 2. COLON AT 80 CM, POLYP: HYPERPLASTIC POLYP. NO ADENOMATOUS  ? CHANGE OR MALIGNANCY IDENTIFIED.  ? ? 3. RIGHT COLON: POLYPOID COLORECTAL MUCOSA. NO ADENOMATOUS  ? CHANGE OR MALIGNANCY IDENTIFIED.  ? ? 4. COLON AT 20 CM, POLYP: HYPERPLASTIC POLYP. NO ADENOMATOUS  ? CHANGE OR MALIGNANCY IDENTIFIED ? ? ? ?   ? ? ? ? ?Past Medical History:  ?Diagnosis Date  ? Anxiety   ? Chronic back pain   ? Depression   ? Diabetes ([place])   ? Esophageal stricture   ? Fibromyalgia   ? GERD (gastroesophageal reflux disease)   ? Hyperplastic colon polyp   ? Hypothyroidism   ? Migraines   ? Neuropathy   ? PTSD (post-traumatic stress disorder)   ? ?Past Surgical History:  ?Procedure Laterality Date  ? AUGMENTATION MAMMAPLASTY Bilateral 10/2016  ? BACK SURGERY    ? COLONOSCOPY    ? FOOT SURGERY Right   ? KNEE ARTHROSCOPY Left   ? KNEE SURGERY Right   ? PARTIAL HYSTERECTOMY    ? ROTATOR CUFF REPAIR Bilateral   ? THYROID SURGERY    ? ?Social History: ? ?Family History:  ? ? reports that she has been smoking e-cigarettes. She has been smoking an average of .5 packs per day. She has never used smokeless tobacco. She reports current alcohol use. She reports that she does not use drugs. ?family history includes Breast cancer in her mother; Colon cancer in her sister; Colon polyps in her sister; Diabetes in her brother, father, mother, and sister; Liver cancer in her mother. ? ?Allergies  ?Allergen Reactions  ? Codeine Rash, Hives and Itching  ? Hydrocodone Itching, Rash and Hives  ? Sulfa Antibiotics Other (See Comments) and Swelling  ? Hydrocodone-Acetaminophen Nausea And Vomiting  ?  Dizzy lightheaded  ? Sulfonamide Derivatives   ?  REACTION: swelling  ? ? ?  ?Outpatient Encounter Medications as of 10/28/2021  ?Medication Sig  ? desvenlafaxine (PRISTIQ) 50 MG 24 hr tablet Take 1 tablet by mouth daily.  ?  dexlansoprazole (DEXILANT) 60 MG capsule Take 60 mg by mouth.  ? levothyroxine (SYNTHROID) 112 MCG tablet Take 112 mcg by mouth every morning.  ? metFORMIN (GLUCOPHAGE-XR) 750 MG 24 hr tablet Take 750 mg by mouth.  ? promethazine (PHENERGAN) 25 MG tablet Take 25 mg by mouth every 6 (six) hours as needed for nausea.  ? topiramate (TOPAMAX) 100 MG tablet Take 100 mg by mouth 2 (two) times daily.  ? topiramate (TOPAMAX) 50 MG tablet Take 50 mg by  mouth.  ? ?No facility-administered encounter medications on file as of 10/28/2021.  ? ? ? ?REVIEW OF SYSTEMS:Gen: Denies fever, sweats or chills. No weight loss.  ?CV: Denies chest pain, palpitations or edema. ?Resp: Denies cough, shortness of breath of hemoptysis.  ?GI: Denies heartburn, dysphagia, stomach or lower abdominal pain. No diarrhea or constipation.  ?GU : Denies urinary burning, blood in urine, increased urinary frequency or incontinence. ?MS: Denies joint pain, muscles aches or weakness. ?Derm: Denies rash, itchiness, skin lesions or unhealing ulcers. ?Psych: Denies depression, anxiety, memory loss, suicidal ideation and confusion. ?Heme: Denies bruising, easy bleeding. ?Neuro:  Denies headaches, dizziness or paresthesias. ?Endo:  Denies any problems with DM, thyroid or adrenal function. ? ?PHYSICAL EXAM: ?There were no vitals taken for this visit. ?General: Well developed ... in no acute distress. ?Head: Normocephalic and atraumatic. ?Eyes:  Sclerae non-icteric, conjunctive pink. ?Ears: Normal auditory acuity. ?Mouth: Dentition intact. No ulcers or lesions.  ?Neck: Supple, no lymphadenopathy or thyromegaly.  ?Lungs: Clear bilaterally to auscultation without wheezes, crackles or rhonchi. ?Heart: Regular rate and rhythm. No murmur, rub or gallop appreciated.  ?Abdomen: Soft, nontender, non distended. No masses. No hepatosplenomegaly. Normoactive bowel sounds x 4 quadrants.  ?Rectal:  ?Musculoskeletal: Symmetrical with no gross deformities. ?Skin: Warm and dry. No rash or lesions on visible extremities. ?Extremities: No edema. ?Neurological: Alert oriented x 4, no focal deficits.  ?Psychological:  Alert and cooperative. Normal mood and affect. ? ?ASSESSMENT AND PLAN: ? ? ? ?CC:  Edmonia James, PA-C ? ? ? ?

## 2021-11-05 ENCOUNTER — Ambulatory Visit: Payer: Medicare Other | Admitting: Nurse Practitioner

## 2021-11-10 NOTE — Progress Notes (Deleted)
? ? ? ?11/10/2021 ?Leighanna Kirn ?[id number] ?1961-08-09 ? ? ?CHIEF COMPLAINT:  ? ?HISTORY OF PRESENT ILLNESS: Jream Broyles is a 61 year old female with a past medical history of anxiety, depression, PTSD, diabetes, neuropathy, hypothyroidism, migraine headaches, GERD and hyperplastic colon polyps. She presents to our office today as referred by Abran Cantor NP to schedule a colonoscopy.  ? ? ?Sister diagnosed with colon cancer in her 33's.  ? ? ?Labs 10/01/2021: Hg 11.8. Hct 39.1. MCV 76. Glu 249. TSH 0.199. Vitamin d 19.8.  ? ?EGD 10/05/2013 by Dr. Olevia Perches: ? ?BENIGN GASTROESOPHAGEAL JUNCTION MUCOSA WITH SLIGHT INFLAMMATION CONSISTENT ?WITH REFLUX. NO INTESTINAL METAPLASIA, HELICOBACTER PYLORI, DYSPLASIA OR ?MALIGNANCY. ? ?Colonoscopy 06/23/2003:  ?5 sessile hyperplastic polyps removed from the colon and rectum ?1. Surgical [P], colon polyp @ 100cm, colon polyp @ 80cm, colon polyp @ 50cm ?- HYPERPLASTIC POLYP AND POLYPOID FRAGMENTS OF BENIGN COLONIC MUCOSA. ?- NO ADENOMATOUS CHANGES OR MALIGNANCY. ?2. Surgical [P], rectum ?- HYPERPLASTIC POLYPS AND POLYPOID FRAGMENTS OF BENIGN COLONIC MUCOSA. ?- NO ADENOMATOUS CHANGES OR MALIGNANCY. ? ?Colonoscopy 05/29/2008: ?Two 9m polyps removed from the cecum and one 359mpolyp removed from the sigmoid colon ?1. ESOPHAGUS, BIOPSIES: BENIGN SQUAMOUS AND GASTRIC CARDIA  ? MUCOSA. NO INTESTINAL METAPLASIA, DYSPLASIA OR MALIGNANCY  ? IDENTIFIED.  ? ? 2. COLON AT 80 CM, POLYP: HYPERPLASTIC POLYP. NO ADENOMATOUS  ? CHANGE OR MALIGNANCY IDENTIFIED.  ? ? 3. RIGHT COLON: POLYPOID COLORECTAL MUCOSA. NO ADENOMATOUS  ? CHANGE OR MALIGNANCY IDENTIFIED.  ? ? 4. COLON AT 20 CM, POLYP: HYPERPLASTIC POLYP. NO ADENOMATOUS  ? CHANGE OR MALIGNANCY IDENTIFIED.  ? ?Colonoscopy 07/16/2021: ?Poor prep ?No polyps  ? ?Colonoscopy 04/11/1997: ?No polyps ?Small internal hemorrhoids  ? ?Past Medical History:  ?Diagnosis Date  ? Anxiety   ? Chronic back pain   ? Depression   ? [place]  ? Esophageal stricture    ? Fibromyalgia   ? GERD (gastroesophageal reflux disease)   ? Hyperplastic colon polyp   ? Hypothyroidism   ? Migraines   ? Neuropathy   ? PTSD (post-traumatic stress disorder)   ? ?Past Surgical History:  ?Procedure Laterality Date  ? AUGMENTATION MAMMAPLASTY Bilateral 10/2016  ? BACK SURGERY    ? COLONOSCOPY    ? FOOT SURGERY Right   ? KNEE ARTHROSCOPY Left   ? KNEE SURGERY Right   ? PARTIAL HYSTERECTOMY    ? ROTATOR CUFF REPAIR Bilateral   ? THYROID SURGERY    ? ?Social History: ? ?Family History: Sister with history of colon polyps and colon cancer. Mother with diabetes, breast and ? Liver cancer. Father with ? Lung cancer. Brohter with diabetes.  ? ? reports that she has been smoking e-cigarettes. She has been smoking an average of .5 packs per day. She has never used smokeless tobacco. She reports current alcohol use. She reports that she does not use drugs. ?family history includes Breast cancer in her mother; Colon cancer in her sister; Colon polyps in her sister; Diabetes in her brother, father, mother, and sister; Liver cancer in her mother. ? ?Allergies  ?Allergen Reactions  ? Codeine Rash, Hives and Itching  ? Hydrocodone Itching, Rash and Hives  ? Sulfa Antibiotics Other (See Comments) and Swelling  ? Hydrocodone-Acetaminophen Nausea And Vomiting  ?  Dizzy lightheaded  ? Sulfonamide Derivatives   ?  REACTION: swelling  ? ? ?  ?Outpatient Encounter Medications as of 11/13/2021  ?Medication Sig  ? desvenlafaxine (PRISTIQ) 50 MG 24 hr tablet Take  1 tablet by mouth daily.  ? dexlansoprazole (DEXILANT) 60 MG capsule Take 60 mg by mouth.  ? levothyroxine (SYNTHROID) 112 MCG tablet Take 112 mcg by mouth every morning.  ? metFORMIN (GLUCOPHAGE-XR) 750 MG 24 hr tablet Take 750 mg by mouth.  ? promethazine (PHENERGAN) 25 MG tablet Take 25 mg by mouth every 6 (six) hours as needed for nausea.  ? topiramate (TOPAMAX) 100 MG tablet Take 100 mg by mouth 2 (two) times daily.  ? topiramate (TOPAMAX) 50 MG tablet Take  50 mg by mouth.  ? ?No facility-administered encounter medications on file as of 11/13/2021.  ? ? ? ?REVIEW OF SYSTEMS: All other systems reviewed and negative except where noted in the History of Present Illness. ?Gen: Denies fever, sweats or chills. No weight loss.  ?CV: Denies chest pain, palpitations or edema. ?Resp: Denies cough, shortness of breath of hemoptysis.  ?GI: Denies heartburn, dysphagia, stomach or lower abdominal pain. No diarrhea or constipation.  ?GU : Denies urinary burning, blood in urine, increased urinary frequency or incontinence. ?MS: Denies joint pain, muscles aches or weakness. ?Derm: Denies rash, itchiness, skin lesions or unhealing ulcers. ?Psych: Denies depression, anxiety, memory loss, suicidal ideation and confusion. ?Heme: Denies bruising, easy bleeding. ?Neuro:  Denies headaches, dizziness or paresthesias. ?Endo:  Denies any problems with DM, thyroid or adrenal function. ? ?PHYSICAL EXAM: ?There were no vitals taken for this visit. ?General: Well developed ... in no acute distress. ?Head: Normocephalic and atraumatic. ?Eyes:  Sclerae non-icteric, conjunctive pink. ?Ears: Normal auditory acuity. ?Mouth: Dentition intact. No ulcers or lesions.  ?Neck: Supple, no lymphadenopathy or thyromegaly.  ?Lungs: Clear bilaterally to auscultation without wheezes, crackles or rhonchi. ?Heart: Regular rate and rhythm. No murmur, rub or gallop appreciated.  ?Abdomen: Soft, nontender, non distended. No masses. No hepatosplenomegaly. Normoactive bowel sounds x 4 quadrants.  ?Rectal:  ?Musculoskeletal: Symmetrical with no gross deformities. ?Skin: Warm and dry. No rash or lesions on visible extremities. ?Extremities: No edema. ?Neurological: Alert oriented x 4, no focal deficits.  ?Psychological:  Alert and cooperative. Normal mood and affect. ? ?ASSESSMENT AND PLAN: ? ? ? ?CC:  Edmonia James, PA-C ? ? ? ?

## 2021-11-13 ENCOUNTER — Ambulatory Visit: Payer: Medicare Other | Admitting: Nurse Practitioner

## 2021-11-19 ENCOUNTER — Other Ambulatory Visit: Payer: Self-pay | Admitting: Family Medicine

## 2021-11-19 DIAGNOSIS — Z1231 Encounter for screening mammogram for malignant neoplasm of breast: Secondary | ICD-10-CM

## 2021-11-20 ENCOUNTER — Ambulatory Visit: Payer: Medicare Other

## 2021-11-28 ENCOUNTER — Inpatient Hospital Stay: Admission: RE | Admit: 2021-11-28 | Payer: Medicare Other | Source: Ambulatory Visit

## 2023-03-30 ENCOUNTER — Encounter: Payer: Self-pay | Admitting: Nurse Practitioner

## 2023-04-09 ENCOUNTER — Ambulatory Visit (INDEPENDENT_AMBULATORY_CARE_PROVIDER_SITE_OTHER): Payer: 59 | Admitting: Podiatry

## 2023-04-09 DIAGNOSIS — Z91199 Patient's noncompliance with other medical treatment and regimen due to unspecified reason: Secondary | ICD-10-CM

## 2023-04-09 NOTE — Progress Notes (Signed)
No show

## 2023-06-12 ENCOUNTER — Ambulatory Visit: Payer: 59 | Admitting: Nurse Practitioner

## 2023-09-30 ENCOUNTER — Ambulatory Visit: Payer: 59 | Admitting: Physician Assistant

## 2023-10-02 ENCOUNTER — Ambulatory Visit: Payer: 59 | Admitting: Physician Assistant

## 2023-10-22 ENCOUNTER — Other Ambulatory Visit: Payer: Self-pay | Admitting: Family Medicine

## 2023-10-22 ENCOUNTER — Encounter: Payer: Self-pay | Admitting: Family Medicine

## 2023-10-22 DIAGNOSIS — Z1231 Encounter for screening mammogram for malignant neoplasm of breast: Secondary | ICD-10-CM

## 2023-10-27 ENCOUNTER — Encounter: Payer: Self-pay | Admitting: Physician Assistant

## 2023-10-27 ENCOUNTER — Other Ambulatory Visit (INDEPENDENT_AMBULATORY_CARE_PROVIDER_SITE_OTHER)

## 2023-10-27 ENCOUNTER — Ambulatory Visit (INDEPENDENT_AMBULATORY_CARE_PROVIDER_SITE_OTHER): Admitting: Physician Assistant

## 2023-10-27 DIAGNOSIS — M25532 Pain in left wrist: Secondary | ICD-10-CM

## 2023-10-27 NOTE — Progress Notes (Signed)
 Office Visit Note   Patient: Brittney Becker           Date of Birth: January 19, 1961           MRN: [id number] Visit Date: 10/27/2023              Requested by: Vivien Presto, MD [phone] [place],  [place] [address] PCP: Vivien Presto, MD   Assessment & Plan: Visit Diagnoses:  1. Pain in left wrist     Plan: Merlene is a pleasant 62 year old right-hand-dominant woman who comes in with a 73-month history of left ulnar-sided wrist pain.  Denies any injuries however she has a large family and is busy taking care of grandchildren and cooking.  She takes meloxicam on a regular basis and is even added Aleve without relief.  She has tried topical Biofreeze.  She has a brace but does not like it.  Evaluation today was consistent with an ECU tendinitis.  We talked about giving her an appropriate brace here as well as working with occupational therapy.  If she got no better could consider an ultrasound-guided injection may follow-up with me in 4 to 6 weeks  Follow-Up Instructions: No follow-ups on file.   Orders:  Orders Placed This Encounter  Procedures   XR Wrist Complete Left   Ambulatory referral to Occupational Therapy   No orders of the defined types were placed in this encounter.     Procedures: No procedures performed   Clinical Data: No additional findings.   Subjective: Chief Complaint  Patient presents with   Right Wrist - Pain    HPI pleasant 63 year old right-hand-dominant woman comes in today for evaluation of left wrist pain.  She states the pain goes down from the side of her short finger into her wrist and past her forearm she has tried topical anti-inflammatories and over-the-counter brace  Review of Systems  All other systems reviewed and are negative.    Objective: Vital Signs: There were no vitals taken for this visit.  Physical Exam Constitutional:      Appearance: Normal appearance.  HENT:     Head: Normocephalic.  Pulmonary:      Effort: Pulmonary effort is normal.  Neurological:     General: No focal deficit present.     Mental Status: She is alert and oriented to person, place, and time.  Psychiatric:        Mood and Affect: Mood normal.        Behavior: Behavior normal.     Ortho Exam Examination of her left wrist she has a strong pulse she has brisk capillary refill.  Pain is isolated over the ECU tendon she does have increase in pain with resisted radial deviation no erythema no signs of cellulitis or infection Specialty Comments:  No specialty comments available.  Imaging: XR Wrist Complete Left Result Date: 10/27/2023 X-rays do not demonstrate any significant degenerative changes no fractures.  Some noted soft tissue swelling over the ulnar styloid but no fracture    PMFS History: Patient Active Problem List   Diagnosis Date Noted   Pain in left wrist 10/27/2023   Primary osteoarthritis of left knee 06/11/2021   Anxiety 12/02/2019   Depression 12/02/2019   Fibromyalgia 12/02/2019   Hypothyroidism 12/02/2019   Migraine 12/02/2019   Neuropathy 12/02/2019   Type II or unspecified type diabetes mellitus without mention of complication, not stated as uncontrolled 12/02/2019   Bilateral shoulder pain 10/08/2012   High risk  medication use 10/08/2012   Lumbar post-laminectomy syndrome 10/08/2012   PTSD (post-traumatic stress disorder) 07/04/2012   Constipation 04/30/2010   FIBROMYALGIA 04/30/2010   ESOPHAGEAL STRICTURE 05/12/2008   GERD (gastroesophageal reflux disease) 05/12/2008   DEPRESSION, HX OF 05/12/2008   Past Medical History:  Diagnosis Date   Anxiety    Chronic back pain    Depression    Diabetes (HCC)    Esophageal stricture    Fibromyalgia    GERD (gastroesophageal reflux disease)    Hyperplastic colon polyp    Hypothyroidism    Migraines    Neuropathy    PTSD (post-traumatic stress disorder)     Family History  Problem Relation Age of Onset   Colon polyps Sister     Colon cancer Sister    Breast cancer Mother    Diabetes Mother    Liver cancer Mother    Diabetes Father    Diabetes Brother    Diabetes Sister    Esophageal cancer Neg Hx    Rectal cancer Neg Hx    Stomach cancer Neg Hx     Past Surgical History:  Procedure Laterality Date   AUGMENTATION MAMMAPLASTY Bilateral 10/2016   BACK SURGERY     COLONOSCOPY     FOOT SURGERY Right    KNEE ARTHROSCOPY Left    KNEE SURGERY Right    PARTIAL HYSTERECTOMY     ROTATOR CUFF REPAIR Bilateral    THYROID SURGERY     Social History   Occupational History   Not on file  Tobacco Use   Smoking status: Every Day    Current packs/day: 0.50    Types: E-cigarettes, Cigarettes   Smokeless tobacco: Never  Vaping Use   Vaping status: Every Day  Substance and Sexual Activity   Alcohol use: Yes    Comment: socially- special occasions   Drug use: No   Sexual activity: Not on file

## 2023-11-11 ENCOUNTER — Ambulatory Visit

## 2023-11-11 ENCOUNTER — Encounter: Admitting: Rehabilitative and Restorative Service Providers"

## 2023-11-12 ENCOUNTER — Ambulatory Visit

## 2023-12-10 ENCOUNTER — Ambulatory Visit

## 2024-06-20 ENCOUNTER — Encounter: Payer: Self-pay | Admitting: Radiology
